# Patient Record
Sex: Male | Born: 1960 | Race: White | Hispanic: No | Marital: Married | Smoking: Never smoker
Health system: Southern US, Community
[De-identification: ages and names within clinical notes are randomized; demographics above are authoritative.]

## PROBLEM LIST (undated history)

## (undated) DIAGNOSIS — J45909 Unspecified asthma, uncomplicated: Secondary | ICD-10-CM

## (undated) DIAGNOSIS — Z91018 Allergy to other foods: Secondary | ICD-10-CM

## (undated) DIAGNOSIS — N200 Calculus of kidney: Secondary | ICD-10-CM

## (undated) HISTORY — PX: LITHOTRIPSY: SUR834

## (undated) HISTORY — PX: UMBILICAL HERNIA REPAIR: SHX196

---

## 1997-06-19 ENCOUNTER — Other Ambulatory Visit: Admission: RE | Admit: 1997-06-19 | Discharge: 1997-06-19 | Payer: Self-pay | Admitting: Specialist

## 1997-10-31 ENCOUNTER — Ambulatory Visit (HOSPITAL_COMMUNITY): Admission: RE | Admit: 1997-10-31 | Discharge: 1997-10-31 | Payer: Self-pay | Admitting: Oral Surgery

## 1997-10-31 ENCOUNTER — Encounter: Payer: Self-pay | Admitting: Oral Surgery

## 1997-12-15 ENCOUNTER — Emergency Department (HOSPITAL_COMMUNITY): Admission: EM | Admit: 1997-12-15 | Discharge: 1997-12-15 | Payer: Self-pay | Admitting: Emergency Medicine

## 1999-05-18 ENCOUNTER — Emergency Department (HOSPITAL_COMMUNITY): Admission: EM | Admit: 1999-05-18 | Discharge: 1999-05-19 | Payer: Self-pay | Admitting: Emergency Medicine

## 2000-06-16 ENCOUNTER — Emergency Department (HOSPITAL_COMMUNITY): Admission: EM | Admit: 2000-06-16 | Discharge: 2000-06-17 | Payer: Self-pay | Admitting: Emergency Medicine

## 2000-06-16 ENCOUNTER — Encounter: Payer: Self-pay | Admitting: Emergency Medicine

## 2002-03-01 ENCOUNTER — Encounter: Payer: Self-pay | Admitting: Emergency Medicine

## 2002-03-01 ENCOUNTER — Emergency Department (HOSPITAL_COMMUNITY): Admission: EM | Admit: 2002-03-01 | Discharge: 2002-03-01 | Payer: Self-pay | Admitting: Emergency Medicine

## 2002-04-27 ENCOUNTER — Encounter: Payer: Self-pay | Admitting: Orthopedic Surgery

## 2002-04-27 ENCOUNTER — Encounter: Admission: RE | Admit: 2002-04-27 | Discharge: 2002-04-27 | Payer: Self-pay | Admitting: Orthopedic Surgery

## 2003-10-02 ENCOUNTER — Emergency Department (HOSPITAL_COMMUNITY): Admission: EM | Admit: 2003-10-02 | Discharge: 2003-10-03 | Payer: Self-pay | Admitting: Emergency Medicine

## 2008-09-16 ENCOUNTER — Emergency Department (HOSPITAL_COMMUNITY): Admission: EM | Admit: 2008-09-16 | Discharge: 2008-09-16 | Payer: Self-pay | Admitting: Emergency Medicine

## 2008-09-23 ENCOUNTER — Encounter: Admission: RE | Admit: 2008-09-23 | Discharge: 2008-09-23 | Payer: Self-pay | Admitting: Chiropractic Medicine

## 2008-10-14 ENCOUNTER — Emergency Department (HOSPITAL_COMMUNITY): Admission: EM | Admit: 2008-10-14 | Discharge: 2008-10-14 | Payer: Self-pay | Admitting: Emergency Medicine
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
## 2010-09-15 IMAGING — CR DG ELBOW COMPLETE 3+V*R*
4 series · 4 of 4 positions shown · non-contrast
Comparison: None available.

CLINICAL DATA: Fall.  Pain and swelling.

RIGHT ELBOW - COMPLETE 3+ VIEW

[x elbow joint ap right]
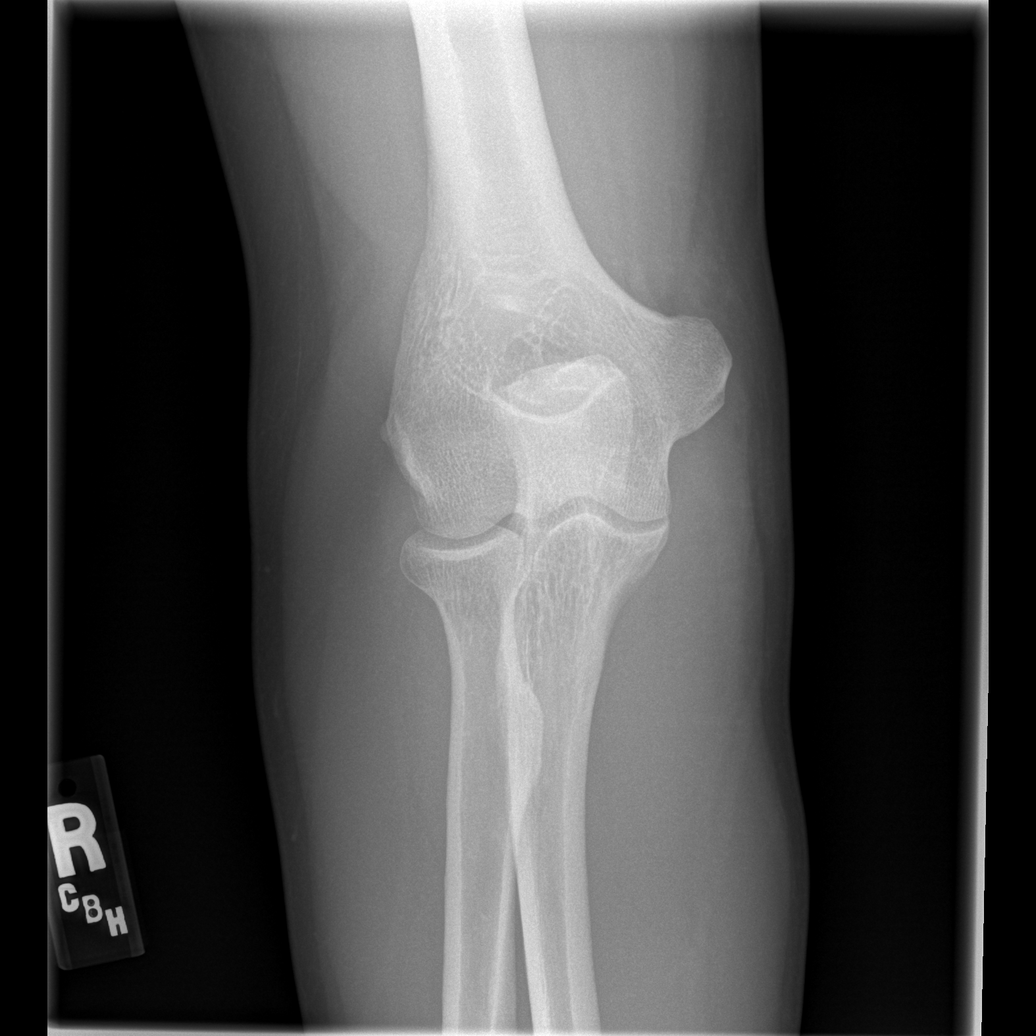

[x elbow joint obl. right (1 of 2)]
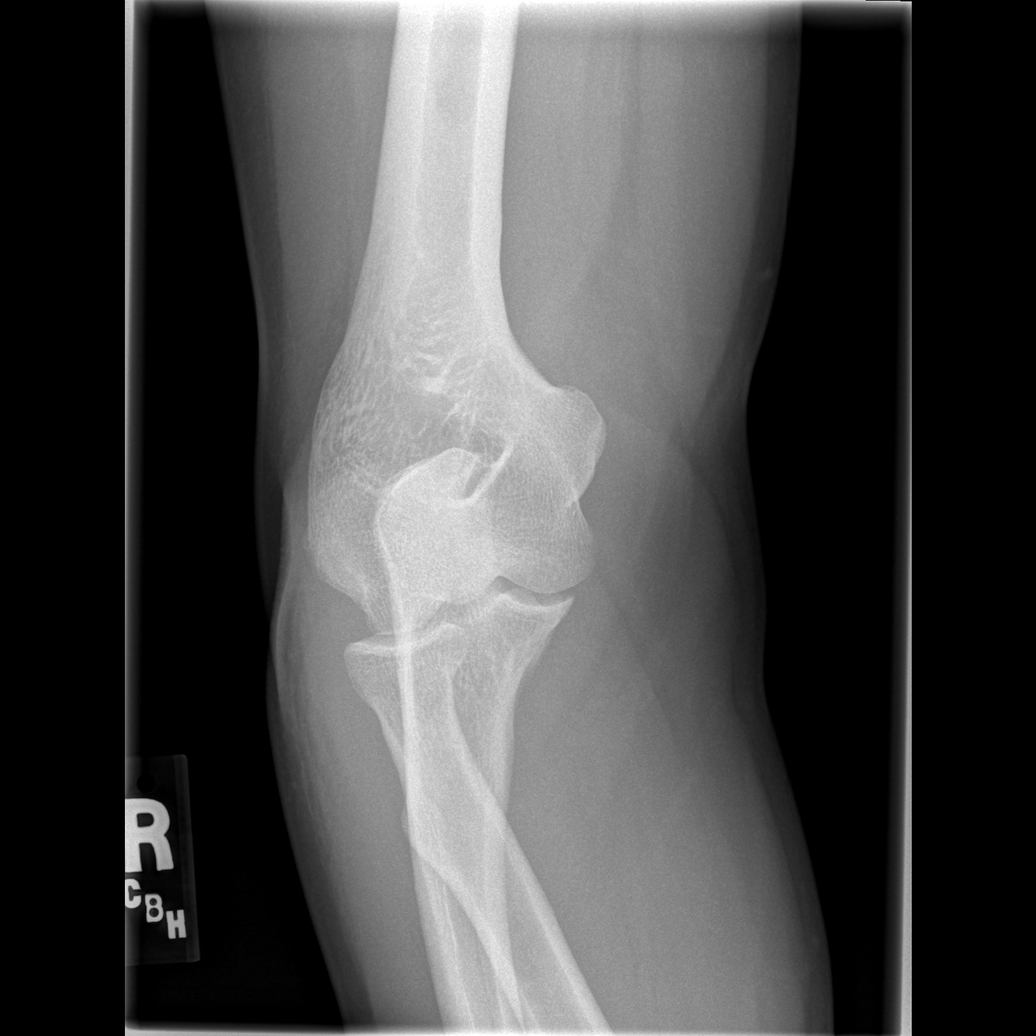

[x elbow joint obl. right (2 of 2)]
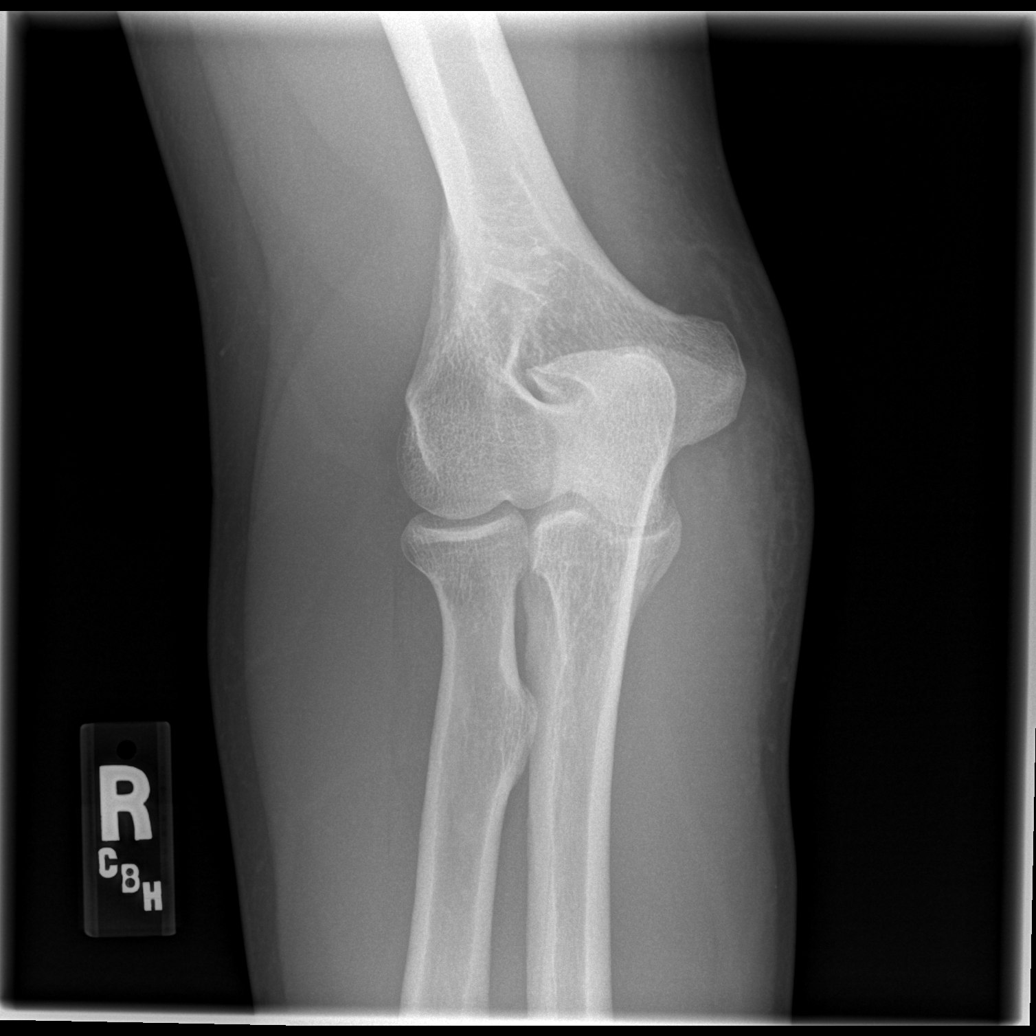

[x elbow joint lat right]
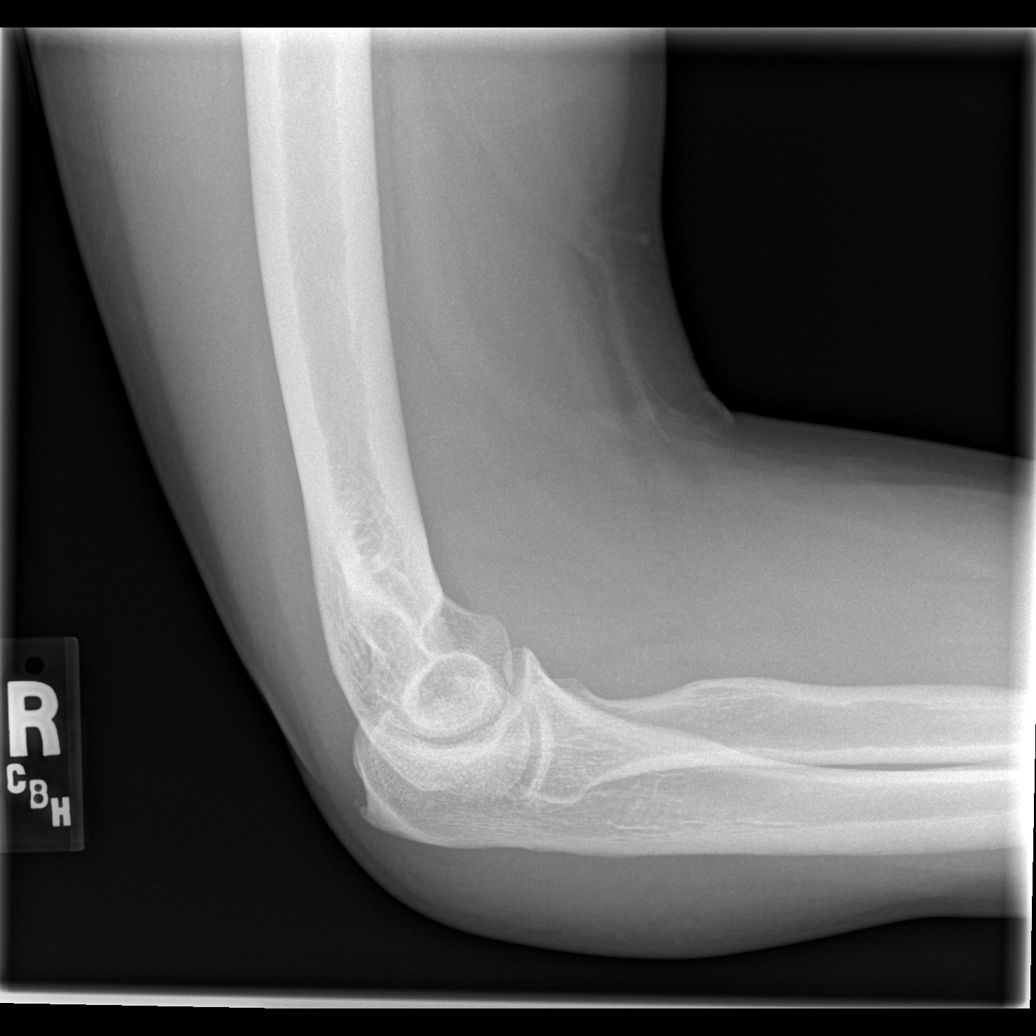

[4 of 4 positions shown; findings below may reference images not displayed]

FINDINGS: No fracture, dislocation or joint effusion is identified.
There appears to be some soft tissue swelling about the medial side
of the elbow.
IMPRESSION: No acute bony or joint abnormality.

## 2010-09-15 IMAGING — CR DG ANKLE COMPLETE 3+V*R*
3 series · 3 of 3 positions shown · non-contrast
Comparison: None available.

CLINICAL DATA: Fall.

RIGHT ANKLE - COMPLETE 3+ VIEW

[t ankle joint ap right]
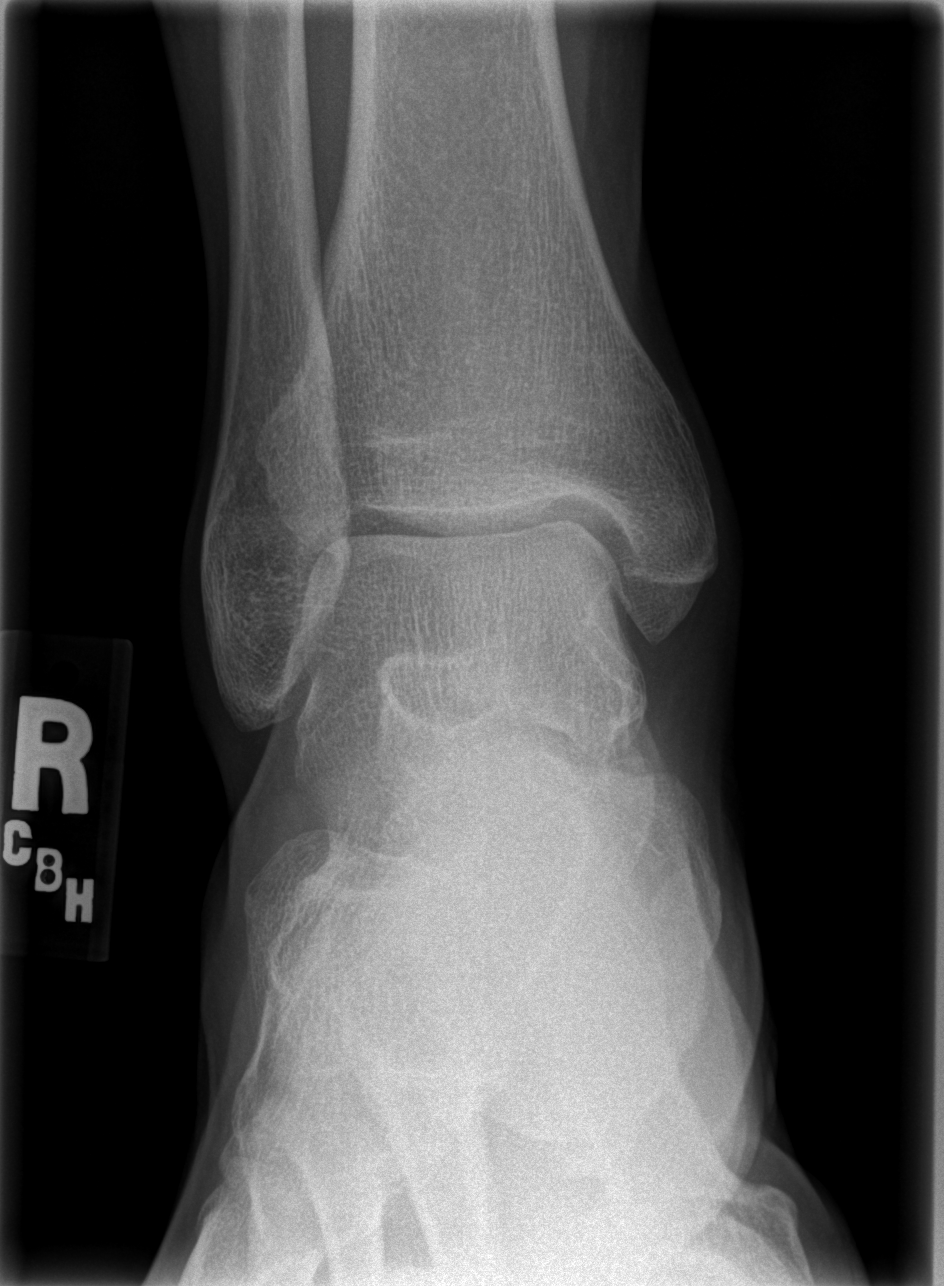

[t ankle joint oblique right]
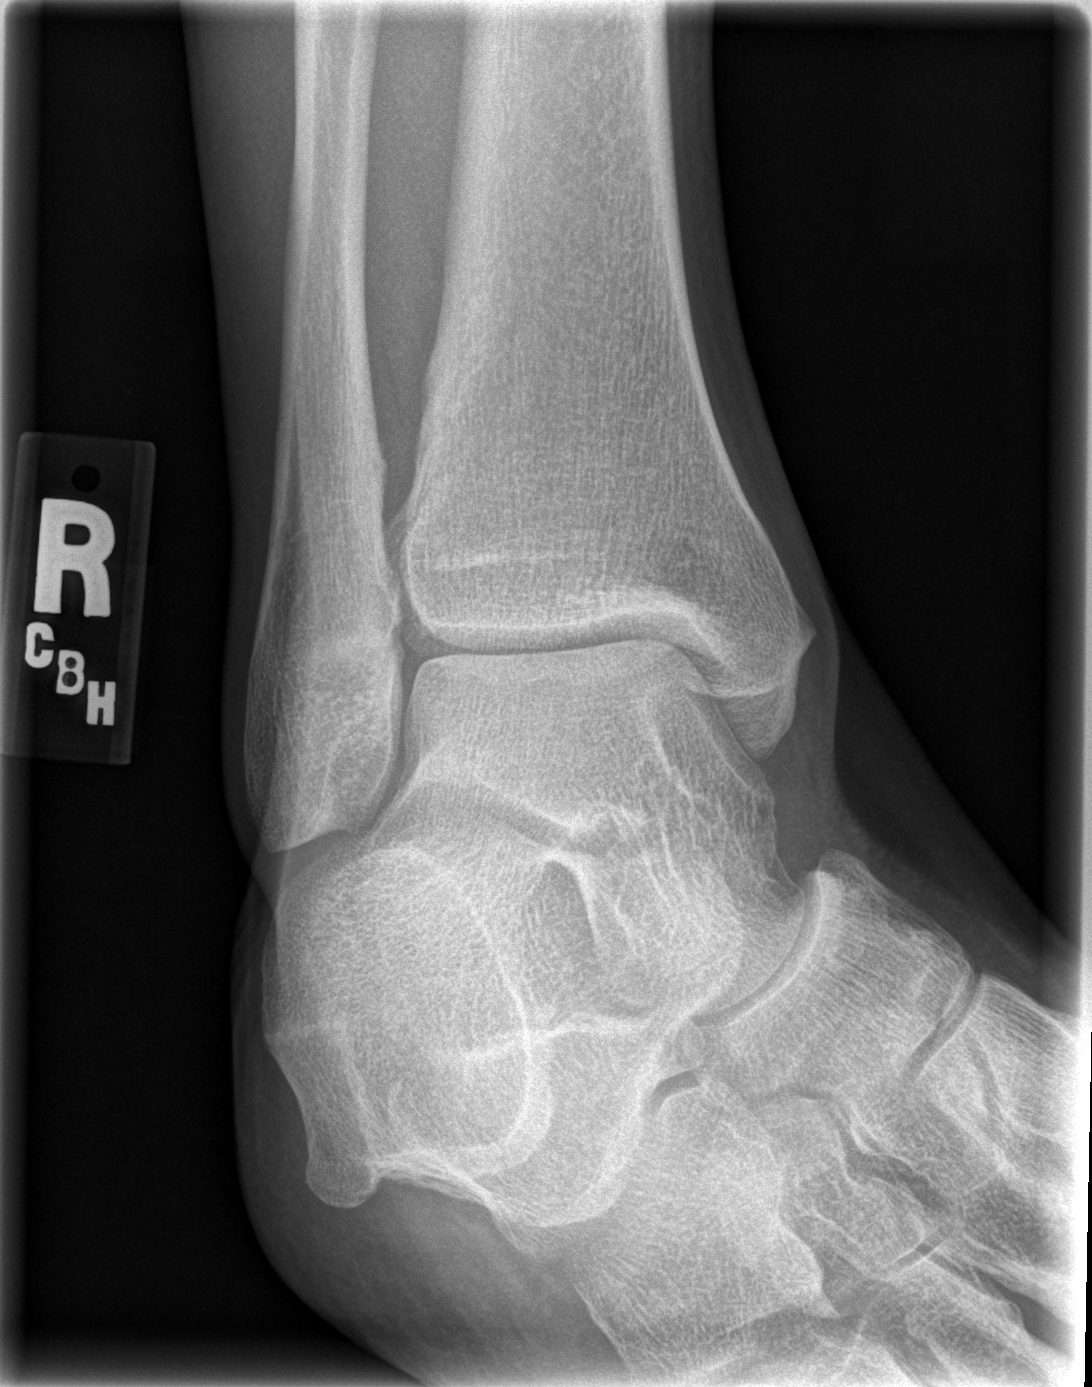

[t ankle joint lat right]
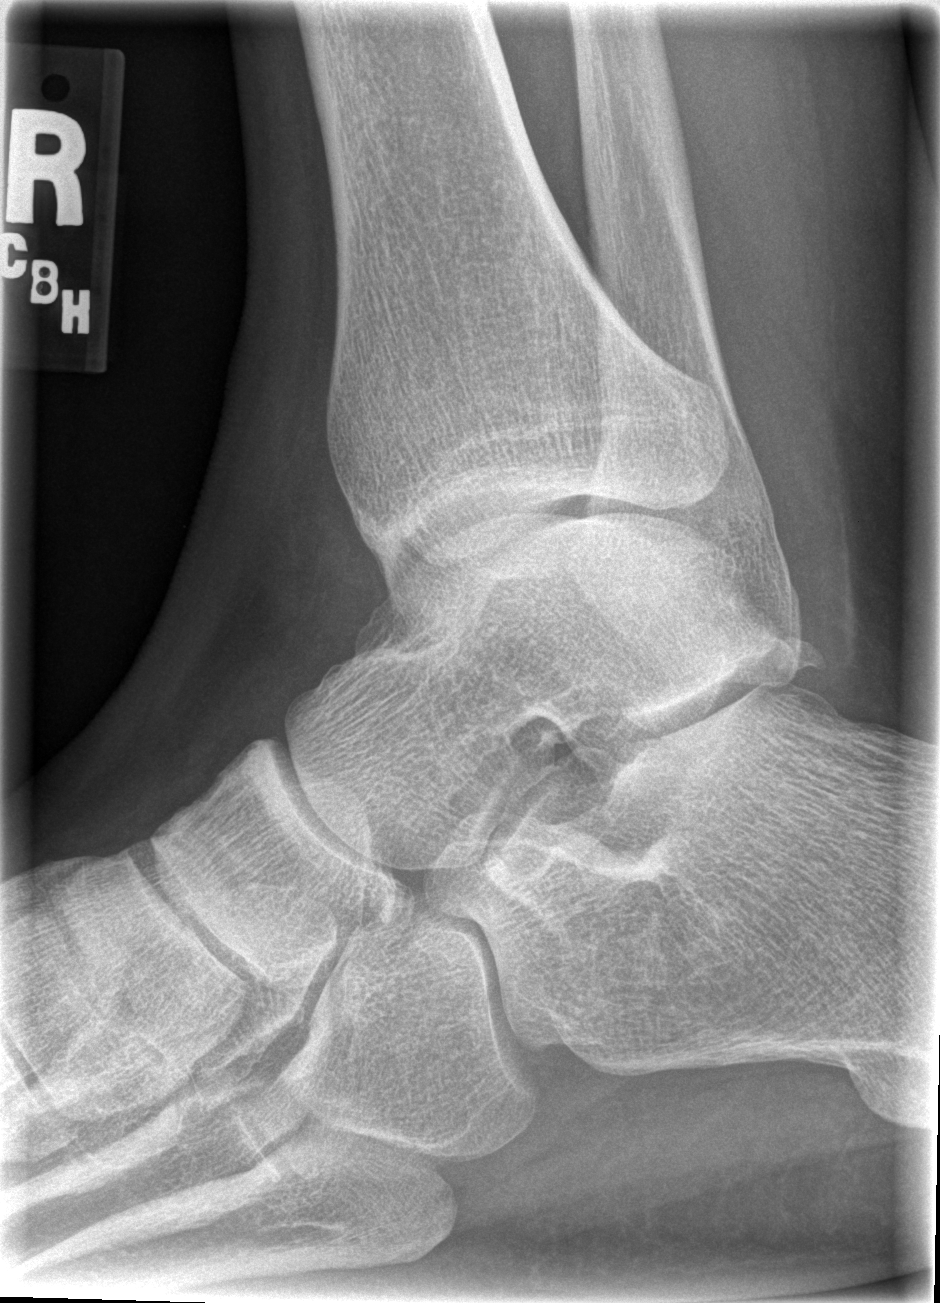

[3 of 3 positions shown; findings below may reference images not displayed]

FINDINGS: Imaged bones, joints and soft tissues appear normal.
IMPRESSION: Negative exam.

## 2014-02-02 ENCOUNTER — Encounter (HOSPITAL_COMMUNITY): Payer: Self-pay | Admitting: *Deleted

## 2014-02-02 ENCOUNTER — Emergency Department (INDEPENDENT_AMBULATORY_CARE_PROVIDER_SITE_OTHER)
Admission: EM | Admit: 2014-02-02 | Discharge: 2014-02-02 | Discharge status: Against Medical Advice | Payer: Self-pay | Source: Home / Self Care

## 2014-02-02 DIAGNOSIS — R221 Localized swelling, mass and lump, neck: Secondary | ICD-10-CM

## 2014-02-02 HISTORY — DX: Calculus of kidney: N20.0

## 2014-02-02 HISTORY — DX: Allergy to other foods: Z91.018

## 2014-02-02 HISTORY — DX: Unspecified asthma, uncomplicated: J45.909

## 2014-02-02 MED ORDER — METHYLPREDNISOLONE SODIUM SUCC 125 MG IJ SOLR
INTRAMUSCULAR | Status: AC
  Filled 2014-02-02: qty 2

## 2014-02-02 MED ORDER — METHYLPREDNISOLONE SODIUM SUCC 125 MG IJ SOLR
125.0000 mg | Freq: Once | INTRAMUSCULAR | Status: AC
  Administered 2014-02-02: 125 mg via INTRAMUSCULAR

## 2014-02-02 NOTE — ED Provider Notes (Addendum)
Nigel SloopMichael W Prisk is a 53 y.o. male who presents to Urgent Care today for throat swelling. Patient is allergic to multiple different bites of food. He ate a little and ring Prilosec and thinks perhaps he is allergic to that. This morning he awoke with throat swelling. 30 minutes ago he took an epinephrine pen and Benadryl which doesn't seem to help much. He is afraid that his throat may swell shut. He denies any trouble breathing or swallowing currently.   Past Medical History  Diagnosis Date  . Asthma   . Multiple food allergies   . Kidney stones    Past Surgical History  Procedure Laterality Date  . Lithotripsy     History  Substance Use Topics  . Smoking status: Never Smoker   . Smokeless tobacco: Not on file  . Alcohol Use: No   ROS as above Medications: No current facility-administered medications for this encounter.   Current Outpatient Prescriptions  Medication Sig Dispense Refill  . Albuterol Sulfate (PROAIR HFA IN) Inhale into the lungs.    Marland Kitchen. EPINEPHrine 0.3 mg/0.3 mL IJ SOAJ injection Inject into the muscle once.     Allergies  Allergen Reactions  . Iodine   . Other     Many food allergies     Exam:  BP 153/97 mmHg  Pulse 96  Temp(Src) 96.7 F (35.9 C) (Oral)  Resp 14  SpO2 95% Gen: Well NAD HEENT: EOMI,  MMM uvula is swollen. Airway is patent. No lip or tongue swelling. Lungs: Normal work of breathing. CTABL Heart: RRR no MRG Abd: NABS, Soft. Nondistended, Nontender Exts: Brisk capillary refill, warm and well perfused.   No results found for this or any previous visit (from the past 24 hour(s)). No results found.  Assessment and Plan: 53 y.o. male with possible food allergy. Patient did not respond well to epinephrine. Plan to transfer to the emergency department. Patient declines EMS. He will go by private vehicle. His wife will drive him. He was given 125 mg of Solu-Medrol IM prior to discharge.  Discussed warning signs or symptoms. Please see  discharge instructions. Patient expresses understanding.     Rodolph BongEvan S Nieshia Larmon, MD 02/02/14 1324   After this note was written but prior to patient being discharged to the emergency room he said he felt better and left AMA.  Rodolph BongEvan S Sherrina Zaugg, MD 02/02/14 [phone]

## 2014-02-02 NOTE — Discharge Instructions (Signed)
Thank you for coming in today. GO Directly to the emergency room

## 2014-02-02 NOTE — ED Notes (Signed)
Pt states he is actually feeling better, and is choosing to sign out AMA.  States he does not want to go to ED because he feels better.  Instructed pt & wife to go their immediately for any worsening, or if they change their minds.  Both verbalized understanding.

## 2014-02-02 NOTE — ED Notes (Signed)
Woke up at 0800 with sensation of swollen throat.  Took Benadryl, also gargled with Benadryl/H2O solution.  Used epi pen @ 1240.  States continues with sensation; reports no real improvement.

## 2014-09-17 ENCOUNTER — Other Ambulatory Visit: Payer: Self-pay | Admitting: Surgery

## 2014-09-17 NOTE — H&P (Signed)
Nigel Sloop 09/17/2014 2:27 PM Location: [facility] Patient #: [id number] DOB: 04-07-1960 Married / Language: [place] / Race: White Male History of Present Illness Ardeth Sportsman MD; 09/17/2014 5:57 PM) Patient words: hernia.  The patient is a 54 year old male who presents with an umbilical hernia. Patient sent for surgical consultation by physician assistant Farris Has. Concern for increasingly symptomatic umbilical hernia. Active overweight male. Had a hernia for at least 13 years. Recently it is become larger in size and become more symptomatic. Getting more sore. Usually has a bowel movement twice a day. No prior belly surgery. He never smokes. No falls or traumas. No fevers chills or sweats. MRSA skin infections. Other Problems Gilmer Mor, CMA; 09/17/2014 2:28 PM) Asthma Back Pain Kidney Stone Umbilical Hernia Repair  Past Surgical History Gilmer Mor, CMA; 09/17/2014 2:28 PM) Oral Surgery  Diagnostic Studies History Gilmer Mor, CMA; 09/17/2014 2:28 PM) Colonoscopy never  Allergies Lamar Laundry Bynum, CMA; 09/17/2014 2:27 PM) Iodine (Antiseptic) *ANTISEPTICS & DISINFECTANTS*  Medication History Lamar Laundry Bynum, CMA; 09/17/2014 2:27 PM) Fluticasone Propionate (50MCG/ACT Suspension, Nasal) Active. Advair HFA (115-21MCG/ACT Aerosol, Inhalation) Active.  Social History Gilmer Mor, CMA; 09/17/2014 2:28 PM) Caffeine use Carbonated beverages. No alcohol use No drug use Tobacco use Never smoker.  Family History Gilmer Mor, CMA; 09/17/2014 2:28 PM) Alcohol Abuse Brother. Arthritis Father, Mother. Heart Disease Father. Melanoma Father.     Review of Systems Lamar Laundry Bynum CMA; 09/17/2014 2:28 PM) General Not Present- Appetite Loss, Chills, Fatigue, Fever, Night Sweats, Weight Gain and Weight Loss. Skin Not Present- Change in Wart/Mole, Dryness, Hives, Jaundice, New Lesions, Non-Healing Wounds, Rash and Ulcer. HEENT Present- Seasonal Allergies and  Wears glasses/contact lenses. Not Present- Earache, Hearing Loss, Hoarseness, Nose Bleed, Oral Ulcers, Ringing in the Ears, Sinus Pain, Sore Throat, Visual Disturbances and Yellow Eyes. Respiratory Present- Snoring. Not Present- Bloody sputum, Chronic Cough, Difficulty Breathing and Wheezing. Breast Not Present- Breast Mass, Breast Pain, Nipple Discharge and Skin Changes. Cardiovascular Present- Shortness of Breath. Not Present- Chest Pain, Difficulty Breathing Lying Down, Leg Cramps, Palpitations, Rapid Heart Rate and Swelling of Extremities. Gastrointestinal Present- Abdominal Pain. Not Present- Bloating, Bloody Stool, Change in Bowel Habits, Chronic diarrhea, Constipation, Difficulty Swallowing, Excessive gas, Gets full quickly at meals, Hemorrhoids, Indigestion, Nausea, Rectal Pain and Vomiting. Male Genitourinary Present- Blood in Urine. Not Present- Change in Urinary Stream, Frequency, Impotence, Nocturia, Painful Urination, Urgency and Urine Leakage. Musculoskeletal Not Present- Back Pain, Joint Pain, Joint Stiffness, Muscle Pain, Muscle Weakness and Swelling of Extremities. Neurological Not Present- Decreased Memory, Fainting, Headaches, Numbness, Seizures, Tingling, Tremor, Trouble walking and Weakness. Psychiatric Not Present- Anxiety, Bipolar, Change in Sleep Pattern, Depression, Fearful and Frequent crying. Endocrine Not Present- Cold Intolerance, Excessive Hunger, Hair Changes, Heat Intolerance, Hot flashes and New Diabetes. Hematology Not Present- Easy Bruising, Excessive bleeding, Gland problems, HIV and Persistent Infections.  Vitals (Sonya Bynum CMA; 09/17/2014 2:28 PM) 09/17/2014 2:28 PM Weight: 231 lb Height: 72in Body Surface Area: 2.31 m Body Mass Index: 31.33 kg/m Temp.: 98.26F(Temporal)  Pulse: 94 (Regular)  BP: 130/82 (Sitting, Left Arm, Standard)     Physical Exam Ardeth Sportsman MD; 09/17/2014 3:22 PM)  General Mental Status-Alert. General  Appearance-Not in acute distress, Not Sickly. Orientation-Oriented X3. Hydration-Well hydrated. Voice-Normal.  Integumentary Global Assessment Upon inspection and palpation of skin surfaces of the - Axillae: non-tender, no inflammation or ulceration, no drainage. and Distribution of scalp and body hair is normal. General Characteristics Temperature - normal warmth is noted.  Head and  Neck Head-normocephalic, atraumatic with no lesions or palpable masses. Face Global Assessment - atraumatic, no absence of expression. Neck Global Assessment - no abnormal movements, no bruit auscultated on the right, no bruit auscultated on the left, no decreased range of motion, non-tender. Trachea-midline. Thyroid Gland Characteristics - non-tender.  Eye Eyeball - Left-Extraocular movements intact, No Nystagmus. Eyeball - Right-Extraocular movements intact, No Nystagmus. Cornea - Left-No Hazy. Cornea - Right-No Hazy. Sclera/Conjunctiva - Left-No scleral icterus, No Discharge. Sclera/Conjunctiva - Right-No scleral icterus, No Discharge. Pupil - Left-Direct reaction to light normal. Pupil - Right-Direct reaction to light normal.  ENMT Ears Pinna - Left - no drainage observed, no generalized tenderness observed. Right - no drainage observed, no generalized tenderness observed. Nose and Sinuses External Inspection of the Nose - no destructive lesion observed. Inspection of the nares - Left - quiet respiration. Right - quiet respiration. Mouth and Throat Lips - Upper Lip - no fissures observed, no pallor noted. Lower Lip - no fissures observed, no pallor noted. Nasopharynx - no discharge present. Oral Cavity/Oropharynx - Tongue - no dryness observed. Oral Mucosa - no cyanosis observed. Hypopharynx - no evidence of airway distress observed.  Chest and Lung Exam Inspection Movements - Normal and Symmetrical. Accessory muscles - No use of accessory muscles in  breathing. Palpation Palpation of the chest reveals - Non-tender. Auscultation Breath sounds - Normal and Clear.  Cardiovascular Auscultation Rhythm - Regular. Murmurs & Other Heart Sounds - Auscultation of the heart reveals - No Murmurs and No Systolic Clicks.  Abdomen Inspection Inspection of the abdomen reveals - No Visible peristalsis and No Abnormal pulsations. Umbilicus - No Bleeding, No Urine drainage. Palpation/Percussion Palpation and Percussion of the abdomen reveal - Soft, Non Tender, No Rebound tenderness, No Rigidity (guarding) and No Cutaneous hyperesthesia. Note: Obese but soft. 3 x 3 cm incarcerated umbilical mass that is eventually reducible. To have centimeter umbilical defect. Smaller supraumbilical defect as well.   Male Genitourinary Sexual Maturity Tanner 5 - Adult hair pattern and Adult penile size and shape.  Peripheral Vascular Upper Extremity Inspection - Left - No Cyanotic nailbeds, Not Ischemic. Right - No Cyanotic nailbeds, Not Ischemic.  Neurologic Neurologic evaluation reveals -normal attention span and ability to concentrate, able to name objects and repeat phrases. Appropriate fund of knowledge , normal sensation and normal coordination. Mental Status Affect - not angry, not paranoid. Cranial Nerves-Normal Bilaterally. Gait-Normal.  Neuropsychiatric Mental status exam performed with findings of-able to articulate well with normal speech/language, rate, volume and coherence, thought content normal with ability to perform basic computations and apply abstract reasoning and no evidence of hallucinations, delusions, obsessions or homicidal/suicidal ideation.  Musculoskeletal Global Assessment Spine, Ribs and Pelvis - no instability, subluxation or laxity. Right Upper Extremity - no instability, subluxation or laxity.  Lymphatic Head & Neck  General Head & Neck Lymphatics: Bilateral - Description - No Localized  lymphadenopathy. Axillary  General Axillary Region: Bilateral - Description - No Localized lymphadenopathy. Femoral & Inguinal  Generalized Femoral & Inguinal Lymphatics: Left - Description - No Localized lymphadenopathy. Right - Description - No Localized lymphadenopathy.    Assessment & Plan Ardeth Sportsman MD; 09/17/2014 3:21 PM)  UMBILICAL HERNIA WITHOUT OBSTRUCTION AND WITHOUT GANGRENE (553.1  K42.9) Impression: Increasingly symptomatic hernia that is reducible with effort. He may have a supraumbilical hernia as well.  I think he would benefit from surgical repair since it has increased in size and makes become symptomatic. He wishes to be aggressive and have it repaired as  well. I would do a laparoscopic exploration and underlay repair with mesh. He is a big active Michelle Piper & has a lot more decades of life and him for this to fall apart, so I would be use mesh repair. He agrees. He wishes to try and coordinate time that is convenient. He seemed disappointed when I told him he would not be able to immediately go back to work. 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  Current Plans Schedule for Surgery Written instructions provided Pt Education - Pamphlet Given - Laparoscopic Hernia Repair: discussed with patient and provided information. The anatomy & physiology of the abdominal wall was discussed. The pathophysiology of hernias was discussed. Natural history risks without surgery including progeressive enlargement, pain, incarceration, & strangulation was discussed. Contributors to complications such as smoking, obesity, diabetes, prior surgery, etc were discussed.  I feel the risks of no intervention will lead to serious problems that outweigh the operative risks; therefore, I recommended surgery to reduce and repair the hernia. I explained laparoscopic techniques with possible need for an open approach. I noted the probable use of mesh to patch  and/or buttress the hernia repair  Risks such as bleeding, infection, abscess, need for further treatment, heart attack, death, and other risks were discussed. I noted a good likelihood this will help address the problem. Goals of post-operative recovery were discussed as well. Possibility that this will not correct all symptoms was explained. I stressed the importance of low-impact activity, aggressive pain control, avoiding constipation, & not pushing through pain to minimize risk of post-operative chronic pain or injury. Possibility of reherniation especially with smoking, obesity, diabetes, immunosuppression, and other health conditions was discussed. We will work to minimize complications.  An educational handout further explaining the pathology & treatment options was given as well. Questions were answered. The patient expresses understanding & wishes to proceed with surgery. Pt Education - [facility] Hernia Post-Op HCI (Marshon Bangs): discussed with patient and provided information. Pt Education - [facility] Pain Control (Laryn Venning)  Ardeth Sportsman, M.D., F.A.C.S. Gastrointestinal and Minimally Invasive Surgery [facility] [address], [address] [place], [place] [address] [phone] Main / Paging

## 2015-08-22 DIAGNOSIS — J45909 Unspecified asthma, uncomplicated: Secondary | ICD-10-CM | POA: Insufficient documentation

## 2015-08-22 DIAGNOSIS — N200 Calculus of kidney: Secondary | ICD-10-CM

## 2015-08-22 HISTORY — DX: Calculus of kidney: N20.0

## 2016-08-18 ENCOUNTER — Encounter: Payer: Self-pay | Admitting: Allergy and Immunology

## 2016-08-18 ENCOUNTER — Ambulatory Visit (INDEPENDENT_AMBULATORY_CARE_PROVIDER_SITE_OTHER): Payer: BLUE CROSS/BLUE SHIELD | Admitting: Allergy and Immunology

## 2016-08-18 VITALS — BP 122/78 | HR 90 | Temp 97.9°F | Resp 19 | Ht 70.5 in | Wt 233.6 lb

## 2016-08-18 DIAGNOSIS — J3089 Other allergic rhinitis: Secondary | ICD-10-CM

## 2016-08-18 DIAGNOSIS — T781XXD Other adverse food reactions, not elsewhere classified, subsequent encounter: Secondary | ICD-10-CM | POA: Diagnosis not present

## 2016-08-18 DIAGNOSIS — J4540 Moderate persistent asthma, uncomplicated: Secondary | ICD-10-CM

## 2016-08-18 DIAGNOSIS — Z91018 Allergy to other foods: Secondary | ICD-10-CM | POA: Diagnosis not present

## 2016-08-18 MED ORDER — FLUTICASONE PROPIONATE HFA 220 MCG/ACT IN AERO: INHALATION_SPRAY | RESPIRATORY_TRACT | 5 refills | Status: DC

## 2016-08-18 MED ORDER — MONTELUKAST SODIUM 10 MG PO TABS: 10.0000 mg | ORAL_TABLET | Freq: Every day | ORAL | 5 refills | Status: DC

## 2016-08-18 NOTE — Progress Notes (Signed)
NEW PATIENT NOTE  Referring Provider: No ref. provider found Primary Provider: No primary care provider on file. Date of office visit: 08/18/2016    Subjective:   Chief Complaint:  Raymond Foster (DOB: 03/17/1960) is a 56 y.o. male who Foster to the clinic on 08/18/2016 with a chief complaint of Nasal Congestion and Allergic Rhinitis  .  HPI: Raymond Foster to this clinic in evaluation of allergic disease.  Raymond Foster has a long history of asthma and allergic rhinoconjunctivitis that Raymond Foster thinks is under pretty good control until Raymond Foster develops a infection. Whenever Raymond Foster develops an infection, which appears to be a frequency of 1 or 2 times per year, Raymond Foster loses control of his asthma. Most recently Raymond Foster ended up in the urgent care center 2 weeks ago with a head cold and required prednisone and Augmentin as Raymond Foster had involvement of his lungs with that episode. Raymond Foster is slowly improving from that episode. Raymond Foster still uses a DuoNeb about once or twice a day at this point.  His asthma is controlled with Advair. It appears to be a perennial problem but may be a little bit more activity during the fall. His triggers include cold air exposure and the development of a head cold. Raymond Foster usually does not use a short acting bronchodilator unless Raymond Foster is sick and then Raymond Foster can use that medication up to 2-3 times per day. His asthma is manifested for the most part as wheezing and shortness of breath. Raymond Foster does not really exercise to any degree.  Raymond Foster does have a history of sneezing and some congestion that usually Foster itself during the fall. This is not a particularly big issue for him and Raymond Foster will take some over-the-counter antihistamine. Raymond Foster does not use a nasal steroid except when Raymond Foster is very sick with an infection. Provoking factors for her symptoms include exposure to pollen.  Raymond Foster also appears to have an issue with food allergies. Raymond Foster has developed very significant reactions with mouth and throat swelling when eating peanuts and  walnuts although Raymond Foster can eat almonds and pistachios with no problem. When Raymond Foster eats apples and various other fruits such as melons and cantaloupe Raymond Foster gets intense mouth itching and throat itching and swelling.  Apparently Raymond Foster was treated with immunotherapy for 4 years which she discontinued approximately 10 years ago.  Past Medical History:  Diagnosis Date  . Asthma   . Kidney stones   . Multiple food allergies     Past Surgical History:  Procedure Laterality Date  . LITHOTRIPSY      Allergies as of 08/18/2016      Reactions   Iodine Other (See Comments)   Can't remember reaction   Other    Other reaction(s): Unknown Many food allergies Many food allergies      Medication List      EPINEPHrine 0.3 mg/0.3 mL Soaj injection Commonly known as:  EPI-PEN Inject into the muscle once.   fluticasone-salmeterol 115-21 MCG/ACT inhaler Commonly known as:  ADVAIR HFA Inhale into the lungs.   PROAIR HFA IN Inhale into the lungs.       Review of systems negative except as noted in HPI / PMHx or noted below:  Review of Systems  Constitutional: Negative.   HENT: Negative.   Eyes: Negative.   Respiratory: Negative.   Cardiovascular: Negative.   Gastrointestinal: Negative.   Genitourinary: Negative.   Musculoskeletal: Negative.   Skin: Negative.   Neurological: Negative.   Endo/Heme/Allergies: Negative.   Psychiatric/Behavioral:  Negative.     History reviewed. No pertinent family history.  Social History   Social History  . Marital status: Single    Spouse name: N/A  . Number of children: N/A  . Years of education: N/A   Occupational History  . Not on file.   Social History Main Topics  . Smoking status: Never Smoker  . Smokeless tobacco: Never Used  . Alcohol use No  . Drug use: No  . Sexual activity: Not on file   Other Topics Concern  . Not on file   Social History Narrative  . No narrative on file    Environmental and Social history  Lives in a  house with a dry environment, a dog located inside the household, carpeting in the bedroom, no plastic on the bed, no plastic on the pillow, and no smokers located inside the household. Raymond Foster works in an [employer].  Objective:   Vitals:   08/18/16 0943  BP: 122/78  Pulse: 90  Resp: 19  Temp: 97.9 F (36.6 C)   Height: 5' 10.5" (179.1 cm) Weight: 233 lb 9.6 oz (106 kg)  Physical Exam  Constitutional: Raymond Foster is well-developed, well-nourished, and in no distress.  Slightly nasal voice  HENT:  Head: Normocephalic. Head is without right periorbital erythema and without left periorbital erythema.  Right Ear: Tympanic membrane, external ear and ear canal normal.  Left Ear: Tympanic membrane, external ear and ear canal normal.  Nose: Nose normal. No mucosal edema or rhinorrhea.  Mouth/Throat: Uvula is midline, oropharynx is clear and moist and mucous membranes are normal. No oropharyngeal exudate.  Eyes: Conjunctivae and lids are normal. Pupils are equal, round, and reactive to light.  Neck: Trachea normal. No tracheal tenderness present. No tracheal deviation present. No thyromegaly present.  Cardiovascular: Normal rate, regular rhythm, S1 normal, S2 normal and normal heart sounds.   No murmur heard. Pulmonary/Chest: Effort normal and breath sounds normal. No stridor. No tachypnea. No respiratory distress. Raymond Foster has no wheezes. Raymond Foster has no rales. Raymond Foster exhibits no tenderness.  Abdominal: Soft. Raymond Foster exhibits no distension and no mass. There is no hepatosplenomegaly. There is no tenderness. There is no rebound and no guarding.  Musculoskeletal: Raymond Foster exhibits no edema or tenderness.  Lymphadenopathy:       Head (right side): No tonsillar adenopathy present.       Head (left side): No tonsillar adenopathy present.    Raymond Foster has no cervical adenopathy.    Raymond Foster has no axillary adenopathy.  Neurological: Raymond Foster is alert. Gait normal.  Skin: No rash noted. Raymond Foster is not diaphoretic. No erythema. No pallor. Nails show  no clubbing.  Psychiatric: Mood and affect normal.    Diagnostics: Allergy skin tests were not performed.   Spirometry was performed and demonstrated an FEV1 of 3.58 @ 93 % of predicted.  Assessment and Plan:    1. Asthma, moderate persistent, well-controlled   2. Other allergic rhinitis   3. Pollen-food allergy, subsequent encounter   4. Food allergy     1. Allergen avoidance measures - dust mite  2. Treat and prevent inflammation:   A. Advair 115 - one inhalation twice a day  3. If needed:   A. ProAir HFA or DuoNeb every 4-6 hours  B. EpiPen, Benadryl, M.D./ER evaluation for allergic reaction  C. OTC antihistamine  D. nasal saline  4. "Action plan" for asthma flare up:   A. continue Advair twice a day  B. start Flovent 220 - 3 inhalations 3 times  a day with spacer  C. Start montelukast 10 mg tablet 1 time per day  D. use ProAir HFA or DuoNeb if needed  5.  Can add OTC Nasacort/Rhinocort/Flonase one-2 sprays each nostril one time per day during periods of upper airway symptoms.  6.  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  Obtain fall flu vaccine  8.  Return to clinic in 6 months or earlier if problem  Muzammil does appear to have a component of atopic disease affecting his airway and hopefully with the plan mentioned above Raymond Foster will do well in a preventative manner and also have a good response to an action plan whenever Raymond Foster does develop a flare of his asthma. 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 Although not indicated for the treatment of oral pollinosis we usually see a very good response regarding this condition when undergoing aero allergen immunotherapy. Raymond Foster is presently considering this option. I will see him back in this clinic in 6 months or earlier if there is a problem.   Laurette Schimke, MD Allergy / Immunology [facility]

## 2016-08-18 NOTE — Patient Instructions (Addendum)
  1. Allergen avoidance measures - dust mite  2. Treat and prevent inflammation:   A. Advair 115 - one inhalation twice a day  3. If needed:   A. ProAir HFA or DuoNeb every 4-6 hours  B. EpiPen, Benadryl, M.D./ER evaluation for allergic reaction  C. OTC antihistamine  D. nasal saline  4. "Action plan" for asthma flare up:   A. continue Advair twice a day  B. start Flovent 220 - 3 inhalations 3 times a day with spacer  C. Start montelukast 10 mg tablet 1 time per day  D. use ProAir HFA or DuoNeb if needed  5.  Can add OTC Nasacort/Rhinocort/Flonase one-2 sprays each nostril one time per day during periods of upper airway symptoms.  6.  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  Obtain fall flu vaccine  8.  Return to clinic in 6 months or earlier if problem

## 2018-10-12 DIAGNOSIS — M6208 Separation of muscle (nontraumatic), other site: Secondary | ICD-10-CM | POA: Insufficient documentation

## 2022-01-06 DIAGNOSIS — M545 Low back pain, unspecified: Secondary | ICD-10-CM | POA: Insufficient documentation

## 2022-06-22 DIAGNOSIS — Z6830 Body mass index (BMI) 30.0-30.9, adult: Secondary | ICD-10-CM | POA: Diagnosis not present

## 2022-06-22 DIAGNOSIS — J0140 Acute pansinusitis, unspecified: Secondary | ICD-10-CM | POA: Diagnosis not present

## 2023-03-14 DIAGNOSIS — M25511 Pain in right shoulder: Secondary | ICD-10-CM | POA: Insufficient documentation

## 2023-03-15 DIAGNOSIS — M79672 Pain in left foot: Secondary | ICD-10-CM | POA: Diagnosis not present

## 2023-03-15 DIAGNOSIS — M25511 Pain in right shoulder: Secondary | ICD-10-CM | POA: Diagnosis not present

## 2023-03-28 ENCOUNTER — Emergency Department (HOSPITAL_BASED_OUTPATIENT_CLINIC_OR_DEPARTMENT_OTHER)
Admission: EM | Admit: 2023-03-28 | Discharge: 2023-03-28 | Disposition: A | Discharge status: Home / Self Care | Payer: 59 | Attending: Emergency Medicine | Admitting: Emergency Medicine

## 2023-03-28 ENCOUNTER — Emergency Department (HOSPITAL_BASED_OUTPATIENT_CLINIC_OR_DEPARTMENT_OTHER): Payer: 59

## 2023-03-28 ENCOUNTER — Other Ambulatory Visit: Payer: Self-pay

## 2023-03-28 ENCOUNTER — Encounter (HOSPITAL_BASED_OUTPATIENT_CLINIC_OR_DEPARTMENT_OTHER): Payer: Self-pay | Admitting: *Deleted

## 2023-03-28 ENCOUNTER — Emergency Department (HOSPITAL_BASED_OUTPATIENT_CLINIC_OR_DEPARTMENT_OTHER): Payer: 59 | Admitting: Radiology

## 2023-03-28 DIAGNOSIS — J45909 Unspecified asthma, uncomplicated: Secondary | ICD-10-CM | POA: Diagnosis not present

## 2023-03-28 DIAGNOSIS — R0602 Shortness of breath: Secondary | ICD-10-CM | POA: Diagnosis not present

## 2023-03-28 DIAGNOSIS — R0789 Other chest pain: Secondary | ICD-10-CM | POA: Diagnosis not present

## 2023-03-28 DIAGNOSIS — R079 Chest pain, unspecified: Secondary | ICD-10-CM | POA: Diagnosis not present

## 2023-03-28 DIAGNOSIS — Z87442 Personal history of urinary calculi: Secondary | ICD-10-CM | POA: Diagnosis not present

## 2023-03-28 DIAGNOSIS — K859 Acute pancreatitis without necrosis or infection, unspecified: Secondary | ICD-10-CM | POA: Diagnosis not present

## 2023-03-28 DIAGNOSIS — R1013 Epigastric pain: Secondary | ICD-10-CM | POA: Diagnosis not present

## 2023-03-28 DIAGNOSIS — Z7951 Long term (current) use of inhaled steroids: Secondary | ICD-10-CM | POA: Diagnosis not present

## 2023-03-28 DIAGNOSIS — N200 Calculus of kidney: Secondary | ICD-10-CM | POA: Diagnosis not present

## 2023-03-28 DIAGNOSIS — K760 Fatty (change of) liver, not elsewhere classified: Secondary | ICD-10-CM | POA: Diagnosis not present

## 2023-03-28 LAB — HEPATIC FUNCTION PANEL
ALT: 32 U/L (ref 0–44)
AST: 18 U/L (ref 15–41)
Albumin: 4.4 g/dL (ref 3.5–5.0)
Alkaline Phosphatase: 67 U/L (ref 38–126)
Bilirubin, Direct: 0.2 mg/dL (ref 0.0–0.2)
Indirect Bilirubin: 0.6 mg/dL (ref 0.3–0.9)
Total Bilirubin: 0.8 mg/dL (ref 0.0–1.2)
Total Protein: 7.7 g/dL (ref 6.5–8.1)

## 2023-03-28 LAB — URINALYSIS, W/ REFLEX TO CULTURE (INFECTION SUSPECTED)
Bacteria, UA: NONE SEEN
Bilirubin Urine: NEGATIVE
Glucose, UA: NEGATIVE mg/dL
Ketones, ur: NEGATIVE mg/dL
Leukocytes,Ua: NEGATIVE
Nitrite: NEGATIVE
Specific Gravity, Urine: 1.021 (ref 1.005–1.030)
pH: 7 (ref 5.0–8.0)

## 2023-03-28 LAB — CBC
HCT: 49.5 % (ref 39.0–52.0)
Hemoglobin: 16.9 g/dL (ref 13.0–17.0)
MCH: 31.4 pg (ref 26.0–34.0)
MCHC: 34.1 g/dL (ref 30.0–36.0)
MCV: 91.8 fL (ref 80.0–100.0)
Platelets: 223 10*3/uL (ref 150–400)
RBC: 5.39 MIL/uL (ref 4.22–5.81)
RDW: 13.4 % (ref 11.5–15.5)
WBC: 10.7 10*3/uL — ABNORMAL HIGH (ref 4.0–10.5)
nRBC: 0 % (ref 0.0–0.2)

## 2023-03-28 LAB — BASIC METABOLIC PANEL
Anion gap: 9 (ref 5–15)
BUN: 24 mg/dL — ABNORMAL HIGH (ref 8–23)
CO2: 28 mmol/L (ref 22–32)
Calcium: 9.3 mg/dL (ref 8.9–10.3)
Chloride: 101 mmol/L (ref 98–111)
Creatinine, Ser: 1.36 mg/dL — ABNORMAL HIGH (ref 0.61–1.24)
GFR, Estimated: 59 mL/min — ABNORMAL LOW (ref >60)
Glucose, Bld: 108 mg/dL — ABNORMAL HIGH (ref 70–99)
Potassium: 4.2 mmol/L (ref 3.5–5.1)
Sodium: 138 mmol/L (ref 135–145)

## 2023-03-28 LAB — LIPASE, BLOOD: Lipase: 228 U/L — ABNORMAL HIGH (ref 11–51)

## 2023-03-28 LAB — TRIGLYCERIDES: Triglycerides: 136 mg/dL (ref <150)

## 2023-03-28 LAB — TROPONIN I (HIGH SENSITIVITY)
Troponin I (High Sensitivity): 4 ng/L (ref <18)
Troponin I (High Sensitivity): 3 ng/L (ref <18)

## 2023-03-28 MED ORDER — FENTANYL CITRATE PF 50 MCG/ML IJ SOSY: 50.0000 ug | PREFILLED_SYRINGE | INTRAMUSCULAR | Status: DC | PRN

## 2023-03-28 MED ORDER — ALUM & MAG HYDROXIDE-SIMETH 200-200-20 MG/5ML PO SUSP
30.0000 mL | Freq: Once | ORAL | Status: AC
  Administered 2023-03-28: 30 mL via ORAL
  Filled 2023-03-28: qty 30

## 2023-03-28 MED ORDER — LACTATED RINGERS IV BOLUS
1000.0000 mL | Freq: Once | INTRAVENOUS | Status: AC
  Administered 2023-03-28: 1000 mL via INTRAVENOUS

## 2023-03-28 MED ORDER — HYDROCODONE-ACETAMINOPHEN 5-325 MG PO TABS: 1.0000 | ORAL_TABLET | Freq: Four times a day (QID) | ORAL | 0 refills | Status: DC | PRN

## 2023-03-28 MED ORDER — FENTANYL CITRATE PF 50 MCG/ML IJ SOSY: 50.0000 ug | PREFILLED_SYRINGE | Freq: Once | INTRAMUSCULAR | Status: DC

## 2023-03-28 MED ORDER — LIDOCAINE VISCOUS HCL 2 % MT SOLN
15.0000 mL | Freq: Once | OROMUCOSAL | Status: AC
  Administered 2023-03-28: 15 mL via ORAL
  Filled 2023-03-28: qty 15

## 2023-03-28 MED ORDER — ONDANSETRON 4 MG PO TBDP: 4.0000 mg | ORAL_TABLET | Freq: Three times a day (TID) | ORAL | 0 refills | Status: DC | PRN

## 2023-03-28 NOTE — ED Notes (Signed)
 Pt refused to be swabbed for COVID/flu/RSV.

## 2023-03-28 NOTE — ED Notes (Signed)
 Patient transported to CT

## 2023-03-28 NOTE — ED Provider Notes (Signed)
 I assumed care of the patient at 330 from Dr. Karene Fry.  I have independently visualized and interpreted pt's images today. CT without evidence of significant pancreas abnormality.  Radiology reports bilateral renal stones but no other acute abnormalities.   Gwyneth Sprout, MD 03/28/23 [phone]

## 2023-03-28 NOTE — ED Provider Notes (Signed)
 [facility] Provider Note   CSN: [id number] Arrival date & time: 03/28/23  1127     History  Chief Complaint  Patient presents with   Chest Pain    Raymond Foster is a 63 y.o. male.   Chest Pain Associated symptoms: abdominal pain and nausea      63 year old male with medical history significant for asthma, kidney stones, multiple food allergies who presents to the emergency department with epigastric abdominal pain radiating to his chest.  The patient has had symptoms for the past day or so.  He endorses mild nausea, no vomiting.  He is passing gas and moving his bowels normally.  No fevers, chills or cough.  He felt some generalized abdominal bloating and discomfort as well which has since resolved.  He is tolerating oral intake.  Some burning sensation as well in his epigastrium.  Some radiation to his back.  Home Medications Prior to Admission medications   Medication Sig Start Date End Date Taking? Authorizing Provider  Albuterol Sulfate (PROAIR HFA IN) Inhale into the lungs.    [provider]  EPINEPHrine 0.3 mg/0.3 mL IJ SOAJ injection Inject into the muscle once.    [provider]  fluticasone (FLOVENT HFA) 220 MCG/ACT inhaler 3 inhalations three times a day with spacer 08/18/16   Kozlow, Alvira Philips, MD  fluticasone-salmeterol (ADVAIR HFA) 115-21 MCG/ACT inhaler Inhale into the lungs.    [provider]  montelukast (SINGULAIR) 10 MG tablet Take 1 tablet (10 mg total) by mouth at bedtime. 08/18/16   Kozlow, Alvira Philips, MD      Allergies    Iodine and Other    Review of Systems   Review of Systems  Cardiovascular:  Positive for chest pain.  Gastrointestinal:  Positive for abdominal pain and nausea.  All other systems reviewed and are negative.   Physical Exam Updated Vital Signs BP (!) 128/96   Pulse 67   Temp 97.8 F (36.6 C)   Resp 12   SpO2 97%  Physical Exam Vitals and nursing note  reviewed.  Constitutional:      General: He is not in acute distress.    Appearance: He is well-developed.  HENT:     Head: Normocephalic and atraumatic.  Eyes:     Conjunctiva/sclera: Conjunctivae normal.  Cardiovascular:     Rate and Rhythm: Normal rate and regular rhythm.     Heart sounds: No murmur heard. Pulmonary:     Effort: Pulmonary effort is normal. No respiratory distress.     Breath sounds: Normal breath sounds.  Abdominal:     Palpations: Abdomen is soft.     Tenderness: There is abdominal tenderness in the epigastric area.     Comments: Mild epigastric tenderness to palpation, no rebound or guarding.  Musculoskeletal:        General: No swelling.     Cervical back: Neck supple.  Skin:    General: Skin is warm and dry.     Capillary Refill: Capillary refill takes less than 2 seconds.  Neurological:     Mental Status: He is alert.  Psychiatric:        Mood and Affect: Mood normal.     ED Results / Procedures / Treatments   Labs (all labs ordered are listed, but only abnormal results are displayed) Labs Reviewed  BASIC METABOLIC PANEL - Abnormal; Notable for the following components:      Result Value   Glucose, Bld 108 (*)  BUN 24 (*)    Creatinine, Ser 1.36 (*)    GFR, Estimated 59 (*)    All other components within normal limits  CBC - Abnormal; Notable for the following components:   WBC 10.7 (*)    All other components within normal limits  LIPASE, BLOOD - Abnormal; Notable for the following components:   Lipase 228 (*)    All other components within normal limits  RESP PANEL BY RT-PCR (RSV, FLU A&B, COVID)  RVPGX2  HEPATIC FUNCTION PANEL  TRIGLYCERIDES  TROPONIN I (HIGH SENSITIVITY)  TROPONIN I (HIGH SENSITIVITY)    EKG EKG Interpretation Date/Time:  Monday March 28 2023 11:34:06 EST Ventricular Rate:  83 PR Interval:  142 QRS Duration:  86 QT Interval:  340 QTC Calculation: 399 R Axis:   -4  Text Interpretation: Normal sinus  rhythm Normal ECG When compared with ECG of 02-Oct-2003 22:16, Incomplete right bundle branch block is no longer Present Confirmed by Ernie Avena ([id number]) on 03/28/2023 11:55:57 AM  Radiology DG Chest 2 View Result Date: 03/28/2023 CLINICAL DATA:  Chest pain.  Shortness of breath. EXAM: CHEST - 2 VIEW COMPARISON:  10/14/2008. FINDINGS: Bilateral lung fields are clear. Bilateral costophrenic angles are clear. Normal cardio-mediastinal silhouette. No acute osseous abnormalities. The soft tissues are within normal limits. IMPRESSION: No active cardiopulmonary disease. Electronically Signed   By: Jules Schick M.D.   On: 03/28/2023 15:16    Procedures Procedures    Medications Ordered in ED Medications  fentaNYL (SUBLIMAZE) injection 50 mcg (has no administration in time range)  alum & mag hydroxide-simeth (MAALOX/MYLANTA) 200-200-20 MG/5ML suspension 30 mL (30 mLs Oral Given 03/28/23 1308)    And  lidocaine (XYLOCAINE) 2 % viscous mouth solution 15 mL (15 mLs Oral Given 03/28/23 1308)  lactated ringers bolus 1,000 mL (1,000 mLs Intravenous New Bag/Given 03/28/23 1505)    ED Course/ Medical Decision Making/ A&P Clinical Course as of 03/28/23 1546  Mon Mar 28, 2023  1409 Lipase(!): 228 [JL]    Clinical Course User Index [JL] Ernie Avena, MD             HEART Score: 2                    Medical Decision Making Amount and/or Complexity of Data Reviewed Labs: ordered. Decision-making details documented in ED Course. Radiology: ordered.  Risk OTC drugs. Prescription drug management.    63 year old male with medical history significant for asthma, kidney stones, multiple food allergies who presents to the emergency department with epigastric abdominal pain radiating to his chest.  The patient has had symptoms for the past day or so.  He endorses mild nausea, no vomiting.  He is passing gas and moving his bowels normally.  No fevers, chills or cough.  He felt some generalized abdominal  bloating and discomfort as well which has since resolved.  He is tolerating oral intake.  Some burning sensation as well in his epigastrium.  Some radiation to his back.  On arrival, the patient was afebrile, not tachycardic or tachypneic, hemodynamically stable, saturating her percent on room air.  Patient with epigastric tenderness on exam.  Some radiation to the chest.  Some mild shortness of breath associated with the discomfort, at this time patient is relatively chest and abdominal pain-free.  Differential diagnosis includes pancreatitis, less likely ACS, viral syndrome, gastroenteritis, less likely PE, considered GERD and gastritis, considered constipation.  Low concern for small bowel obstruction or other acute intra-abdominal abnormality given  patient improvement in symptoms, passage of gas and stool.  Initial EKG revealed sinus rhythm, ventricular rate 83, no acute ischemic changes.  A chest x-ray was performed which revealed no acute cardiac or pulmonary abnormality.  Laboratory evaluation revealed normal cardiac troponins, BMP with mildly elevated but stable serum creatinine at 1.36, hepatic function panel normal, CBC with a nonspecific leukocytosis to 10.7, lipase elevated to 228.  Suspect likely acute pancreatitis in the setting of the patient's presentation.  CT of the abdomen pelvis without contrast was ordered given the patient's history of contrast allergy.  Patient was administered an LR bolus, tolerating oral Maalox and lidocaine without significant complaint, did not require pain medication in the ER, declined COVID and flu testing.  Patient does not drink alcohol, has no history of gallstones.  Patient CT imaging pending at time of signout, signout likely plan for discharge home for management of mild acute pancreatitis.  Signout given to Dr. Anitra Lauth at 1500 pending results of CT imaging and reassessment.   Final Clinical Impression(s) / ED Diagnoses Final diagnoses:  Acute  pancreatitis, unspecified complication status, unspecified pancreatitis type    Rx / DC Orders ED Discharge Orders     None         Ernie Avena, MD 03/28/23 1546

## 2023-03-28 NOTE — ED Triage Notes (Addendum)
 Pt is here for CP and sob.  CP is in central chest and is radiating to his back

## 2023-03-28 NOTE — Discharge Instructions (Addendum)
 Stick with a very bland diet over the next few days and then gradually start adding foods back in.  If you start having severe pain, vomiting or things are getting worse and not better return to the emergency room.

## 2023-06-04 DIAGNOSIS — Z789 Other specified health status: Secondary | ICD-10-CM | POA: Diagnosis not present

## 2023-06-04 DIAGNOSIS — I10 Essential (primary) hypertension: Secondary | ICD-10-CM | POA: Diagnosis not present

## 2023-06-04 DIAGNOSIS — Z760 Encounter for issue of repeat prescription: Secondary | ICD-10-CM | POA: Diagnosis not present

## 2023-06-04 DIAGNOSIS — J4520 Mild intermittent asthma, uncomplicated: Secondary | ICD-10-CM | POA: Diagnosis not present

## 2023-06-04 DIAGNOSIS — Z6828 Body mass index (BMI) 28.0-28.9, adult: Secondary | ICD-10-CM | POA: Diagnosis not present

## 2023-07-13 DIAGNOSIS — M25511 Pain in right shoulder: Secondary | ICD-10-CM | POA: Diagnosis not present

## 2023-07-28 DIAGNOSIS — M109 Gout, unspecified: Secondary | ICD-10-CM | POA: Diagnosis not present

## 2023-07-28 DIAGNOSIS — Z7951 Long term (current) use of inhaled steroids: Secondary | ICD-10-CM | POA: Diagnosis not present

## 2023-07-28 DIAGNOSIS — J45909 Unspecified asthma, uncomplicated: Secondary | ICD-10-CM | POA: Diagnosis not present

## 2023-07-28 DIAGNOSIS — I10 Essential (primary) hypertension: Secondary | ICD-10-CM | POA: Diagnosis not present

## 2023-08-24 ENCOUNTER — Ambulatory Visit (HOSPITAL_BASED_OUTPATIENT_CLINIC_OR_DEPARTMENT_OTHER): Admitting: Family Medicine

## 2023-08-24 ENCOUNTER — Encounter (HOSPITAL_BASED_OUTPATIENT_CLINIC_OR_DEPARTMENT_OTHER): Payer: Self-pay | Admitting: Family Medicine

## 2023-08-24 ENCOUNTER — Other Ambulatory Visit (HOSPITAL_BASED_OUTPATIENT_CLINIC_OR_DEPARTMENT_OTHER): Payer: Self-pay

## 2023-08-24 VITALS — BP 124/85 | HR 71 | Ht 70.5 in | Wt 223.9 lb

## 2023-08-24 DIAGNOSIS — J4540 Moderate persistent asthma, uncomplicated: Secondary | ICD-10-CM | POA: Diagnosis not present

## 2023-08-24 DIAGNOSIS — Z91018 Allergy to other foods: Secondary | ICD-10-CM | POA: Diagnosis not present

## 2023-08-24 DIAGNOSIS — M1A00X0 Idiopathic chronic gout, unspecified site, without tophus (tophi): Secondary | ICD-10-CM | POA: Insufficient documentation

## 2023-08-24 DIAGNOSIS — I10 Essential (primary) hypertension: Secondary | ICD-10-CM | POA: Insufficient documentation

## 2023-08-24 MED ORDER — LISINOPRIL 10 MG PO TABS
10.0000 mg | ORAL_TABLET | Freq: Every day | ORAL | 5 refills | Status: DC
  Filled 2023-08-24: qty 30, 30d supply, fill #0
  Filled 2023-09-13 – 2023-09-17 (×2): qty 30, 30d supply, fill #1
  Filled 2023-10-28: qty 30, 30d supply, fill #2

## 2023-08-24 MED ORDER — AIRSUPRA 90-80 MCG/ACT IN AERO
2.0000 | INHALATION_SPRAY | RESPIRATORY_TRACT | 3 refills | Status: AC | PRN
  Filled 2023-08-24: qty 10.7, 10d supply, fill #0

## 2023-08-24 MED ORDER — EPINEPHRINE 0.3 MG/0.3ML IJ SOAJ
0.3000 mg | INTRAMUSCULAR | 5 refills | Status: DC | PRN
  Filled 2023-08-24: qty 2, 28d supply, fill #0

## 2023-08-24 MED ORDER — ALLOPURINOL 100 MG PO TABS
200.0000 mg | ORAL_TABLET | Freq: Every day | ORAL | 5 refills | Status: DC
  Filled 2023-08-24: qty 60, 30d supply, fill #0
  Filled 2023-10-28: qty 60, 30d supply, fill #1

## 2023-08-24 MED ORDER — FLUTICASONE-SALMETEROL 115-21 MCG/ACT IN AERO
2.0000 | INHALATION_SPRAY | Freq: Two times a day (BID) | RESPIRATORY_TRACT | 5 refills | Status: DC
  Filled 2023-08-24 – 2023-10-28 (×4): qty 12, 30d supply, fill #0

## 2023-08-24 NOTE — Progress Notes (Signed)
 New Patient Office Visit  Subjective:   Raymond Foster 1960-03-28 08/24/2023  Chief Complaint  Patient presents with   New Patient (Initial Visit)    Patient is here today to get established with the practice. Has been having problems with his right shoulder.    HPI: Raymond Foster presents today to establish care at [facility] at [facility]. Introduced to Publishing rights manager role and practice setting.  All questions answered.   Last PCP: [facility]  Last annual physical: 2023 Concerns: See below    RIGHT SHOULDER PAIN :  Patient states he has been having chronic right shoulder pain with lifting of RUE. He has been seeing [facility] for chronic pain and has had xray and steroid injections in Feb and June of this year without significant relief. He is scheduled to return and discus MRI.    HYPERTENSION: Raymond Foster presents for the medical management of hypertension.  Patient's current hypertension medication regimen is: Lisinopril  20mg  (patient desiring to d/c medication due to weight loss, diet and exercise with improvement of BP)  Patient is  currently taking prescribed medications for HTN.  Patient is  regularly keeping a check on BP at home.  Adhering to low sodium diet: Yes Exercising Regularly: yes Denies headache, dizziness, CP, SHOB, vision changes.    BP Readings from Last 3 Encounters:  08/24/23 124/85  03/28/23 (!) 141/99  08/18/16 122/78    Multiple Food Allergies:  Patient reports multiple food allergies and has a list of foods he has to avoid due to allergic reactions. He does need a refill of his Epi Pen as his las pen has expired. He is interested in discussing further medications/injections with Allergist and returning to Allergist [place] due to constraints with his diet. He denies recent allergic reaction.    The following portions of the patient's history were reviewed and updated as appropriate: past  medical history, past surgical history, family history, social history, allergies, medications, and problem list.   Patient Active Problem List   Diagnosis Date Noted   Multiple food allergies 08/24/2023   Idiopathic chronic gout without tophus 08/24/2023   Essential (primary) hypertension 08/24/2023   Pain in joint of right shoulder 03/14/2023   Asthma 08/22/2015   Past Medical History:  Diagnosis Date   Asthma    Diastasis of rectus abdominis 10/12/2018   Added automatically from request for surgery [id number]     Kidney stones    Kidney stones 08/22/2015   Low back pain 01/06/2022   Multiple food allergies    Past Surgical History:  Procedure Laterality Date   LITHOTRIPSY     UMBILICAL HERNIA REPAIR     History reviewed. No pertinent family history. Social History   Socioeconomic History   Marital status: Married    Spouse name: Not on file   Number of children: Not on file   Years of education: Not on file   Highest education level: Not on file  Occupational History   Not on file  Tobacco Use   Smoking status: Never   Smokeless tobacco: Never  Vaping Use   Vaping status: Never Used  Substance and Sexual Activity   Alcohol use: No   Drug use: No   Sexual activity: Not on file  Other Topics Concern   Not on file  Social History Narrative   Not on file   Social Drivers of Health   Financial Resource Strain: Unknown (03/11/2021)  Received from [facility]   Overall Financial Resource Strain (CARDIA)    Difficulty of Paying Living Expenses: Patient declined  Food Insecurity: Unknown (03/11/2021)   Received from [facility]   Hunger Vital Sign    Within the past 12 months, you worried that your food would run out before you got the money to buy more.: Patient declined    Within the past 12 months, the food you bought just didn't last and you didn't have money to get more.: Patient declined  Transportation Needs: Unknown (03/11/2021)   Received from [facility] - Transportation    Lack of Transportation (Medical): Patient declined    Lack of Transportation (Non-Medical): Patient declined  Physical Activity: Sufficiently Active (03/11/2021)   Received from [facility]   Exercise Vital Sign    On average, how many days per week do you engage in moderate to strenuous exercise (like a brisk walk)?: 3 days    On average, how many minutes do you engage in exercise at this level?: 60 min  Stress: Unknown (03/11/2021)   Received from [facility] - Occupational Stress Questionnaire    Feeling of Stress : Patient declined  Social Connections: Unknown (06/11/2021)   Received from [facility]   Social Network    Social Network: Not on file  Intimate Partner Violence: Unknown (05/11/2021)   Received from [facility]   HITS    Physically Hurt: Not on file    Insult or Talk Down To: Not on file    Threaten Physical Harm: Not on file    Scream or Curse: Not on file   Outpatient Medications Prior to Visit  Medication Sig Dispense Refill   Albuterol  Sulfate (PROAIR  HFA IN) Inhale into the lungs.     allopurinol  (ZYLOPRIM ) 100 MG tablet Take 2 tablets by mouth daily.     EPINEPHrine  0.3 mg/0.3 mL IJ SOAJ injection Inject into the muscle once.     lisinopril  (ZESTRIL ) 30 MG tablet Take 1 tablet by mouth daily.     fluticasone  (FLOVENT  HFA) 220 MCG/ACT inhaler 3 inhalations three times a day with spacer (Patient not taking: Reported on 08/24/2023) 1 Inhaler 5   fluticasone -salmeterol (ADVAIR  HFA) 115-21 MCG/ACT inhaler Inhale into the lungs. (Patient not taking: Reported on 08/24/2023)     HYDROcodone -acetaminophen  (NORCO/VICODIN) 5-325 MG tablet Take 1-2 tablets by mouth every 6 (six) hours as needed. 10 tablet 0   montelukast  (SINGULAIR ) 10 MG tablet Take 1 tablet (10 mg total) by mouth at bedtime. 30 tablet 5   ondansetron  (ZOFRAN -ODT) 4 MG disintegrating tablet Take 1 tablet (4 mg total) by mouth every 8  (eight) hours as needed. 20 tablet 0   No facility-administered medications prior to visit.   Allergies  Allergen Reactions   Iodine Other (See Comments)    Can't remember reaction   Other     Other reaction(s): Unknown Many food allergies Many food allergies   Shellfish Allergy Swelling    ROS: A complete ROS was performed with pertinent positives/negatives noted in the HPI. The remainder of the ROS are negative.   Objective:   Today's Vitals   08/24/23 0910  BP: 124/85  Pulse: 71  SpO2: 98%  Weight: 223 lb 14.4 oz (101.6 kg)  Height: 5' 10.5 (1.791 m)    GENERAL: Well-appearing, in NAD. Well nourished.  SKIN: Pink, warm and dry.  Head: Normocephalic. NECK: Trachea midline. Full ROM w/o pain or  tenderness.  RESPIRATORY: Chest wall symmetrical. Respirations even and non-labored.  MSK: Muscle tone and strength appropriate for age. + Decreased abduction and internal rotation of RUE with pain.  NEUROLOGIC: No motor or sensory deficits. Steady, even gait. C2-C12 intact.  PSYCH/MENTAL STATUS: Alert, oriented x 3. Cooperative, appropriate mood and affect.    Health Maintenance Due  Topic Date Due   HIV Screening  Never done   Hepatitis C Screening  Never done    No results found for any visits on 08/24/23.     Assessment & Plan:  1. Moderate persistent asthma without complication (Primary) Controlled. Will continue Advair  and will see if Airsupra  is covered for his rescue. Use of medication reviewed with patient. Coupon provided and refills sent.   2. Multiple food allergies Epi Pen refilled. Recommend discussing biologic/injectable medications with Allergist. He states it has been several years since seeing [place] [facility], new referral placed.  - Ambulatory referral to Allergy  3. Idiopathic chronic gout without tophus, unspecified site Controlled. Allopurinol  refilled. Will obtain labs with upcoming AE.   4. Essential (primary) hypertension Controlled and  improved. PCP agreeable to decrase his Lisinopril  to 10mg . Pt to monitor BP.     Patient to reach out to office if new, worrisome, or unresolved symptoms arise or if no improvement in patient's condition. Patient verbalized understanding and is agreeable to treatment plan. All questions answered to patient's satisfaction.    Return in about 6 weeks (around 10/05/2023) for ANNUAL PHYSICAL (fasting labs at visit) .    Thersia Schuyler Stark, [place]

## 2023-08-26 ENCOUNTER — Other Ambulatory Visit (HOSPITAL_BASED_OUTPATIENT_CLINIC_OR_DEPARTMENT_OTHER): Payer: Self-pay

## 2023-08-30 ENCOUNTER — Other Ambulatory Visit (HOSPITAL_BASED_OUTPATIENT_CLINIC_OR_DEPARTMENT_OTHER): Payer: Self-pay

## 2023-09-13 ENCOUNTER — Other Ambulatory Visit (HOSPITAL_BASED_OUTPATIENT_CLINIC_OR_DEPARTMENT_OTHER): Payer: Self-pay

## 2023-10-28 ENCOUNTER — Other Ambulatory Visit (HOSPITAL_BASED_OUTPATIENT_CLINIC_OR_DEPARTMENT_OTHER): Payer: Self-pay

## 2023-11-07 ENCOUNTER — Ambulatory Visit (INDEPENDENT_AMBULATORY_CARE_PROVIDER_SITE_OTHER): Admitting: Family Medicine

## 2023-11-07 ENCOUNTER — Encounter (HOSPITAL_BASED_OUTPATIENT_CLINIC_OR_DEPARTMENT_OTHER): Payer: Self-pay | Admitting: Family Medicine

## 2023-11-07 VITALS — BP 122/91 | HR 68 | Ht 70.5 in | Wt 223.9 lb

## 2023-11-07 DIAGNOSIS — Z125 Encounter for screening for malignant neoplasm of prostate: Secondary | ICD-10-CM | POA: Diagnosis not present

## 2023-11-07 DIAGNOSIS — Z91018 Allergy to other foods: Secondary | ICD-10-CM

## 2023-11-07 DIAGNOSIS — R0683 Snoring: Secondary | ICD-10-CM | POA: Insufficient documentation

## 2023-11-07 DIAGNOSIS — Z0000 Encounter for general adult medical examination without abnormal findings: Secondary | ICD-10-CM

## 2023-11-07 DIAGNOSIS — L819 Disorder of pigmentation, unspecified: Secondary | ICD-10-CM

## 2023-11-07 DIAGNOSIS — Z13220 Encounter for screening for lipoid disorders: Secondary | ICD-10-CM

## 2023-11-07 DIAGNOSIS — I10 Essential (primary) hypertension: Secondary | ICD-10-CM

## 2023-11-07 MED ORDER — LISINOPRIL 20 MG PO TABS: 20.0000 mg | ORAL_TABLET | Freq: Every day | ORAL | 3 refills | Status: DC

## 2023-11-07 MED ORDER — NEFFY 2 MG/0.1ML NA SOLN: 1.0000 | NASAL | 6 refills | Status: DC | PRN

## 2023-11-07 NOTE — Patient Instructions (Signed)
 [phone]  [phone]

## 2023-11-07 NOTE — Progress Notes (Signed)
 Subjective:   Raymond Foster 03-09-1960 11/07/2023  CC: Chief Complaint  Patient presents with   Annual Exam    Patient is here today for his physical. Does have some things that he wants to discuss with PCP.    HPI: Raymond Foster is a 63 y.o. male who presents for a routine health maintenance exam.  Labs collected at time of visit.    FOOD ALLERGIES:  Patient requesting to try [facility] epinephrine  nasal spray for anaphylaxis with multiple food allergies. Patient currently carries epi pen and would like to use Neffi nasal spray for reaction. He has a list of approx. 30 or more food allergies that he carries daily.   SLEEP APNEA: Raymond Foster presents for the medical management of snoring. He is concerned about possible OSA and has never had any evaluation. Reports history of snoring, fatigue, somnolence for several years and headaches. CPAP use:  no Last sleep study: never  Daytime hypersomnolence:  yes Fatigue:  yes Insomnia:  no Good sleep hygiene:  yes Snoring:  yes Observed apnea by bed partner: yes, reported by spouse.    HYPERTENSION: Raymond Foster presents for the medical management of hypertension.  Patient's current hypertension medication regimen is: Lisinopril  10mg  (patient desired to decrease dosage at last visit). Reports Bp has been elevated since decrease of dosage from 20mg  to 10mg .  Patient is  currently taking prescribed medications for HTN.  Patient is  regularly keeping a check on BP at home.  Adhering to low sodium diet: Yes Exercising Regularly: Yes Denies headache, dizziness, CP, SHOB, vision changes.   BP Readings from Last 3 Encounters:  11/07/23 (!) 122/91  08/24/23 124/85  03/28/23 (!) 141/99      HEALTH SCREENINGS: - Vision Screening: not applicable - Dental Visits: up to date - Testicular Exam: Declined - STD Screening: Declined - PSA (50+): Ordered today  No results found for: PSA1, PSA   - Colonoscopy (45+): Up to  date  Discussed with patient purpose of the colonoscopy is to detect colon cancer at curable precancerous or early stages  - AAA Screening: Not applicable  Men age 92-75 who have ever smoked - Lung Cancer screening with low-dose CT: Not applicable-  Adults age 27-80 who are current cigarette smokers or quit within the last 15 years. Must have 20 pack year history.   Depression and Anxiety Screen done today and results listed below:     11/07/2023    1:05 PM 08/24/2023    9:15 AM  Depression screen PHQ 2/9  Decreased Interest 0 0  Down, Depressed, Hopeless 0 0  PHQ - 2 Score 0 0  Altered sleeping 0 0  Tired, decreased energy 0 3  Change in appetite 0 0  Feeling bad or failure about yourself  0 0  Trouble concentrating 0 0  Moving slowly or fidgety/restless 0 0  Suicidal thoughts 0 0  PHQ-9 Score 0 3  Difficult doing work/chores Not difficult at all Not difficult at all      11/07/2023    1:06 PM 08/24/2023    9:15 AM  GAD 7 : Generalized Anxiety Score  Nervous, Anxious, on Edge 0 0  Control/stop worrying 0 0  Worry too much - different things 0 0  Trouble relaxing 0 0  Restless 0 0  Easily annoyed or irritable 0 0  Afraid - awful might happen 0 0  Total GAD 7 Score 0 0  Anxiety Difficulty Not difficult at all  Not difficult at all    IMMUNIZATIONS:  - Tdap: Tetanus vaccination status reviewed: Declined. - Influenza: Refused - Pneumovax: Not applicable - Prevnar: Not applicable - Shingrix vaccine (50+): UTD per patient    Past medical history, surgical history, medications, allergies, family history and social history reviewed with patient today and changes made to appropriate areas of the chart.   Past Medical History:  Diagnosis Date   Asthma    Diastasis of rectus abdominis 10/12/2018   Added automatically from request for surgery [id number]     Kidney stones    Kidney stones 08/22/2015   Low back pain 01/06/2022   Multiple food allergies     Past Surgical  History:  Procedure Laterality Date   LITHOTRIPSY     UMBILICAL HERNIA REPAIR      Current Outpatient Medications on File Prior to Visit  Medication Sig   Albuterol  Sulfate (PROAIR  HFA IN) Inhale into the lungs.   Albuterol -Budesonide  (AIRSUPRA ) 90-80 MCG/ACT AERO Inhale 2 puffs into the lungs every 4 (four) hours as needed (shortness of breath).   allopurinol  (ZYLOPRIM ) 100 MG tablet Take 2 tablets (200 mg total) by mouth daily.   EPINEPHrine  0.3 mg/0.3 mL IJ SOAJ injection Inject 0.3 mg into the muscle as needed for anaphylaxis.   fluticasone -salmeterol (ADVAIR  HFA) 115-21 MCG/ACT inhaler Inhale 2 puffs into the lungs 2 (two) times daily. (Patient not taking: Reported on 11/07/2023)   No current facility-administered medications on file prior to visit.    Allergies  Allergen Reactions   Iodine Other (See Comments)    Can't remember reaction   Other     Other reaction(s): Unknown Many food allergies Many food allergies   Shellfish Allergy Swelling     Social History   Socioeconomic History   Marital status: Married    Spouse name: Not on file   Number of children: Not on file   Years of education: Not on file   Highest education level: Not on file  Occupational History   Not on file  Tobacco Use   Smoking status: Never   Smokeless tobacco: Never  Vaping Use   Vaping status: Never Used  Substance and Sexual Activity   Alcohol use: No   Drug use: No   Sexual activity: Not on file  Other Topics Concern   Not on file  Social History Narrative   Not on file   Social Drivers of Health   Financial Resource Strain: Unknown (03/11/2021)   Received from [facility]   Overall Financial Resource Strain (CARDIA)    Difficulty of Paying Living Expenses: Patient declined  Food Insecurity: Unknown (03/11/2021)   Received from [facility]   Hunger Vital Sign    Within the past 12 months, you worried that your food would run out before you got the money to buy more.:  Patient declined    Within the past 12 months, the food you bought just didn't last and you didn't have money to get more.: Patient declined  Transportation Needs: Unknown (03/11/2021)   Received from [facility] - Transportation    Lack of Transportation (Medical): Patient declined    Lack of Transportation (Non-Medical): Patient declined  Physical Activity: Sufficiently Active (03/11/2021)   Received from [facility]   Exercise Vital Sign    On average, how many days per week do you engage in moderate to strenuous exercise (like a brisk walk)?: 3 days    On average, how many minutes do you  engage in exercise at this level?: 60 min  Stress: Unknown (03/11/2021)   Received from [facility] of Occupational Health - Occupational Stress Questionnaire    Feeling of Stress : Patient declined  Social Connections: Unknown (06/11/2021)   Received from [facility]   Social Network    Social Network: Not on file  Intimate Partner Violence: Unknown (05/11/2021)   Received from [facility]   HITS    Physically Hurt: Not on file    Insult or Talk Down To: Not on file    Threaten Physical Harm: Not on file    Scream or Curse: Not on file   Social History   Tobacco Use  Smoking Status Never  Smokeless Tobacco Never   Social History   Substance and Sexual Activity  Alcohol Use No     History reviewed. No pertinent family history.   ROS: Denies fever, fatigue, unexplained weight loss/gain, CP, SHOB, and palpatitations. Denies neurological deficits, gastrointestinal and/or genitourinary complaints, and skin changes.   Objective:   Today's Vitals   11/07/23 1302 11/07/23 1337  BP: (!) 139/98 (!) 122/91  Pulse: 68   SpO2: 97%   Weight: 223 lb 14.4 oz (101.6 kg)   Height: 5' 10.5 (1.791 m)     GENERAL APPEARANCE: Well-appearing, in NAD. Well nourished.  SKIN: Pink, warm and dry. Turgor normal. No rash, , ulceration, or ecchymoses. Pink papular and  pigmented lesion present to right temple scalp. Hair evenly distributed.  HEENT: HEAD: Normocephalic.  EYES: PERRLA. EOMI. Lids intact w/o defect. Sclera white, Conjunctiva pink w/o exudate.  EARS: External ear w/o redness, swelling, masses or lesions. EAC clear. TM's intact, translucent w/o bulging, appropriate landmarks visualized. Appropriate acuity to conversational tones.  NOSE: Septum midline w/o deformity. Nares patent, mucosa pink and non-inflamed w/o drainage. No sinus tenderness.  THROAT: Uvula midline. Oropharynx clear. Tonsils non-inflamed w/o exudate. Oral mucosa pink and moist.  NECK: Supple, Trachea midline. Full ROM w/o pain or tenderness. No lymphadenopathy. Thyroid non-tender w/o enlargement or palpable masses.  RESPIRATORY: Chest wall symmetrical w/o masses. Respirations even and non-labored. Breath sounds clear to auscultation bilaterally. No wheezes, rales, rhonchi, or crackles. CARDIAC: S1, S2 present, regular rate and rhythm. No gallops, murmurs, rubs, or clicks. PMI w/o lifts, heaves, or thrills. No carotid bruits. Capillary refill <2 seconds. Peripheral pulses 2+ bilaterally. GI: Abdomen soft w/o distention. Normoactive bowel sounds. No palpable masses or tenderness. No guarding or rebound tenderness. Liver and spleen w/o tenderness or enlargement. No CVA tenderness.  GU: pt deferred exam. MSK: Muscle tone and strength appropriate for age, w/o atrophy or abnormal movement. EXTREMITIES: Active ROM intact, w/o tenderness, crepitus, or contracture. No obvious joint deformities or effusions. No clubbing, edema, or cyanosis.  NEUROLOGIC: CN's II-XII intact. Motor strength symmetrical with no obvious weakness. No sensory deficits. DTR 2+ symmetric bilaterally. Steady, even gait.  PSYCH/MENTAL STATUS: Alert, oriented x 3. Cooperative, appropriate mood and affect.     Assessment & Plan:  1. Annual physical exam (Primary) Discussed preventative screenings, vaccines, and healthy  lifestyle with patient.  - CBC with Differential/Platelet - Comprehensive metabolic panel with GFR - Lipid panel - TSH - PSA  2. Essential (primary) hypertension Uncontrolled. Recommend returning to Lisinopril  20mg  daily and return for Bp check in 6 weeks.  - lisinopril  (ZESTRIL ) 20 MG tablet; Take 1 tablet (20 mg total) by mouth daily.  Dispense: 90 tablet; Refill: 3  3. Multiple food allergies Patient was referred to Allergy and  Asthma for management but has not established care. Discussed importance of epi use with anaphylaxis and will send in [facility] for patient. He should use EpiPen  if needed if not covered for [facility]. If use of Neffi, patient agreeable to go to ER after use and call 911.   4. Snoring Referral placed to Pulmonology for OSA evaluation.  - Ambulatory referral to Pulmonology  5. Screening for lipid disorders - Lipid panel  6. Prostate cancer screening - PSA  7. Pigmented skin lesion suspicious for malignant neoplasm Recommend patient obtain Dermatology appt for full body skin check and area of concern. Pt agreeable,declined referral today.     Orders Placed This Encounter  Procedures   CBC with Differential/Platelet   Comprehensive metabolic panel with GFR   Lipid panel   TSH   PSA   Ambulatory referral to Pulmonology    Referral Priority:   Routine    Referral Type:   Consultation    Referral Reason:   Specialty Services Required    Requested Specialty:   Pulmonary Disease    Number of Visits Requested:   1    PATIENT COUNSELING: - Encouraged to adjust caloric intake to maintain or achieve ideal body weight, to reduce intake of dietary saturated fat and total fat, to limit sodium intake by avoiding high sodium foods and not adding table salt, and to maintain adequate dietary potassium and calcium preferably from fresh fruits, vegetables, and low-fat dairy products.   - Advised to avoid cigarette smoking. - Discussed with the patient that most people  either abstain from alcohol or drink within safe limits (<=14/week and <=4 drinks/occasion for males, <=7/weeks and <= 3 drinks/occasion for females) and that the risk for alcohol disorders and other health effects rises proportionally with the number of drinks per week and how often a drinker exceeds daily limits. - Discussed cessation/primary prevention of drug use and availability of treatment for abuse.   - Stressed the importance of regular exercise - Injury prevention: Discussed safety belts, safety helmets, smoke detector, smoking near bedding or upholstery.  - Dental health: Discussed importance of regular tooth brushing, flossing, and dental visits.  - Sexuality: Discussed sexually transmitted diseases, partner selection, use of condoms, avoidance of unintended pregnancy  and contraceptive alternatives.   NEXT PREVENTATIVE PHYSICAL DUE IN 1 YEAR.  Return for 6 weeks BP check only; 1 year AE.  Patient to reach out to office if new, worrisome, or unresolved symptoms arise or if no improvement in patient's condition. Patient verbalized understanding and is agreeable to treatment plan. All questions answered to patient's satisfaction.    Thersia Schuyler Stark, [place]

## 2023-11-08 LAB — CBC WITH DIFFERENTIAL/PLATELET
Basophils Absolute: 0 x10E3/uL (ref 0.0–0.2)
Basos: 0 %
EOS (ABSOLUTE): 0.1 x10E3/uL (ref 0.0–0.4)
Eos: 2 %
Hematocrit: 45.5 % (ref 37.5–51.0)
Hemoglobin: 15.5 g/dL (ref 13.0–17.7)
Immature Grans (Abs): 0 x10E3/uL (ref 0.0–0.1)
Immature Granulocytes: 0 %
Lymphocytes Absolute: 0.9 x10E3/uL (ref 0.7–3.1)
Lymphs: 16 %
MCH: 31.3 pg (ref 26.6–33.0)
MCHC: 34.1 g/dL (ref 31.5–35.7)
MCV: 92 fL (ref 79–97)
Monocytes Absolute: 0.6 x10E3/uL (ref 0.1–0.9)
Monocytes: 10 %
Neutrophils Absolute: 4.1 x10E3/uL (ref 1.4–7.0)
Neutrophils: 72 %
Platelets: 192 x10E3/uL (ref 150–450)
RBC: 4.96 x10E6/uL (ref 4.14–5.80)
RDW: 12.7 % (ref 11.6–15.4)
WBC: 5.8 x10E3/uL (ref 3.4–10.8)

## 2023-11-08 LAB — COMPREHENSIVE METABOLIC PANEL WITH GFR
ALT: 29 IU/L (ref 0–44)
AST: 23 IU/L (ref 0–40)
Albumin: 4.4 g/dL (ref 3.9–4.9)
Alkaline Phosphatase: 80 IU/L (ref 47–123)
BUN/Creatinine Ratio: 14 (ref 10–24)
BUN: 17 mg/dL (ref 8–27)
Bilirubin Total: 0.6 mg/dL (ref 0.0–1.2)
CO2: 23 mmol/L (ref 20–29)
Calcium: 9.6 mg/dL (ref 8.6–10.2)
Chloride: 102 mmol/L (ref 96–106)
Creatinine, Ser: 1.21 mg/dL (ref 0.76–1.27)
Globulin, Total: 2.5 g/dL (ref 1.5–4.5)
Glucose: 87 mg/dL (ref 70–99)
Potassium: 5 mmol/L (ref 3.5–5.2)
Sodium: 139 mmol/L (ref 134–144)
Total Protein: 6.9 g/dL (ref 6.0–8.5)
eGFR: 68 mL/min/1.73 (ref >59)

## 2023-11-08 LAB — LIPID PANEL
Chol/HDL Ratio: 4.2 ratio (ref 0.0–5.0)
Cholesterol, Total: 178 mg/dL (ref 100–199)
HDL: 42 mg/dL (ref >39)
LDL Chol Calc (NIH): 113 mg/dL — ABNORMAL HIGH (ref 0–99)
Triglycerides: 125 mg/dL (ref 0–149)
VLDL Cholesterol Cal: 23 mg/dL (ref 5–40)

## 2023-11-08 LAB — PSA: Prostate Specific Ag, Serum: 2.3 ng/mL (ref 0.0–4.0)

## 2023-11-08 LAB — TSH: TSH: 2.06 u[IU]/mL (ref 0.450–4.500)

## 2023-11-09 ENCOUNTER — Ambulatory Visit (HOSPITAL_BASED_OUTPATIENT_CLINIC_OR_DEPARTMENT_OTHER): Payer: Self-pay | Admitting: Family Medicine

## 2023-11-09 NOTE — Progress Notes (Signed)
 Hi Raymond Foster, Your LDL cholesterol was slightly elevated but is not requiring cholesterol lowering therapies at this time.  Please continue heart healthy diet and regular exercise.  Your blood counts are stable.  Your electrolytes, kidney and liver function are within normal range.  Your thyroid function and prostate-specific antigen are also well-controlled.

## 2023-11-10 ENCOUNTER — Encounter (HOSPITAL_BASED_OUTPATIENT_CLINIC_OR_DEPARTMENT_OTHER): Payer: Self-pay | Admitting: Family Medicine

## 2023-11-11 ENCOUNTER — Other Ambulatory Visit (HOSPITAL_BASED_OUTPATIENT_CLINIC_OR_DEPARTMENT_OTHER): Payer: Self-pay | Admitting: Family Medicine

## 2023-11-11 DIAGNOSIS — I10 Essential (primary) hypertension: Secondary | ICD-10-CM

## 2023-11-11 MED ORDER — FLUTICASONE-SALMETEROL 115-21 MCG/ACT IN AERO: 2.0000 | INHALATION_SPRAY | Freq: Two times a day (BID) | RESPIRATORY_TRACT | 5 refills | Status: AC

## 2023-11-11 MED ORDER — LISINOPRIL 20 MG PO TABS: 20.0000 mg | ORAL_TABLET | Freq: Every day | ORAL | 3 refills | Status: AC

## 2023-11-11 MED ORDER — ALLOPURINOL 100 MG PO TABS: 200.0000 mg | ORAL_TABLET | Freq: Every day | ORAL | 3 refills | Status: AC

## 2023-11-11 MED ORDER — EPINEPHRINE 0.3 MG/0.3ML IJ SOAJ: 0.3000 mg | INTRAMUSCULAR | 5 refills | Status: DC | PRN

## 2023-11-11 NOTE — Telephone Encounter (Signed)
 Please see mychart message sent by pt and advise.

## 2023-11-18 ENCOUNTER — Encounter (HOSPITAL_BASED_OUTPATIENT_CLINIC_OR_DEPARTMENT_OTHER): Payer: Self-pay | Admitting: Family Medicine

## 2023-11-28 ENCOUNTER — Encounter (HOSPITAL_BASED_OUTPATIENT_CLINIC_OR_DEPARTMENT_OTHER): Payer: Self-pay | Admitting: Family Medicine

## 2023-11-28 ENCOUNTER — Other Ambulatory Visit (HOSPITAL_BASED_OUTPATIENT_CLINIC_OR_DEPARTMENT_OTHER): Payer: Self-pay | Admitting: Family Medicine

## 2023-11-28 MED ORDER — EPINEPHRINE 0.3 MG/0.3ML IJ SOAJ: 0.3000 mg | INTRAMUSCULAR | 5 refills | Status: AC | PRN

## 2023-11-30 ENCOUNTER — Encounter (HOSPITAL_BASED_OUTPATIENT_CLINIC_OR_DEPARTMENT_OTHER): Payer: Self-pay

## 2023-11-30 ENCOUNTER — Ambulatory Visit (INDEPENDENT_AMBULATORY_CARE_PROVIDER_SITE_OTHER)

## 2023-11-30 VITALS — BP 122/75 | HR 86 | Ht 70.5 in | Wt 222.4 lb

## 2023-11-30 DIAGNOSIS — G4719 Other hypersomnia: Secondary | ICD-10-CM | POA: Diagnosis not present

## 2023-11-30 NOTE — Patient Instructions (Signed)
 Complete home sleep test; ordered today.  Follow up in 10-12 weeks for sleep test results.  Follow sleep hygiene as discussed.

## 2023-11-30 NOTE — Progress Notes (Signed)
 @Patient  ID: Raymond Foster, male    DOB: Jun 23, 1960, 63 y.o.   MRN: [id number]  Chief Complaint  Patient presents with   Consult    Sleep    Referring provider: Knute Thersia Bitters, *  HPI: Discussed the use of AI scribe software for clinical note transcription with the patient, who gave verbal consent to proceed.  History of Present Illness Raymond Foster is a 63 year old male with asthma and hypertension who presents for a sleep evaluation due to snoring and suspected sleep apnea.  He has a long-standing history of snoring, which has led him to sleep in a separate room. Others have informed him that he stops breathing during sleep. He wakes up feeling exhausted and sometimes experiences sleepiness while driving, although he has not fallen asleep at the wheel. He goes to bed around midnight and wakes up around 8 AM, with occasional awakenings during the night to use the bathroom or due to shortness of breath, especially after eating too much at night.  He has a lifelong history of asthma, currently managed with Advair , Air Supra, and albuterol . He experiences shortness of breath at rest and reports a history of asthma and seasonal allergies. He has a history of sinus surgery following an orbital fracture in the 1980s and has undergone allergy treatments in the past, including injections.  His weight has decreased by about 20 pounds intentionally through diet and exercise. He has a history of pancreatitis and kidney stones, which led him to stop consuming caffeine about eight months ago. He does not smoke or consume alcohol, having quit alcohol over 40 years ago. He denies any drug use.  He experiences a racing heart upon waking up. No cough, fever, chills, irregular heartbeats, or palpitations.     TEST/EVENTS :  Epworth of 13 at today's appt  Chest 2view 03/28/2023:   Narrative & Impression  CLINICAL DATA:  Chest pain.  Shortness of breath.   EXAM: CHEST - 2 VIEW    COMPARISON:  10/14/2008.   FINDINGS: Bilateral lung fields are clear. Bilateral costophrenic angles are clear.   Normal cardio-mediastinal silhouette.   No acute osseous abnormalities.   The soft tissues are within normal limits.   IMPRESSION: No active cardiopulmonary disease.    Allergies  Allergen Reactions   Iodine Other (See Comments)    Can't remember reaction   Other     Other reaction(s): Unknown Many food allergies Many food allergies   Shellfish Allergy Swelling    Immunization History  Administered Date(s) Administered   Influenza Inj Mdck Quad Pf 11/10/2017   Influenza Inj Mdck Quad With Preservative 01/02/2019   Influenza, Quadrivalent, Recombinant, Inj, Pf 11/08/2017   Influenza-Unspecified 11/06/2016   Pneumococcal Polysaccharide-23 11/10/2017   Tdap 02/09/2011   Zoster Recombinant(Shingrix) 01/02/2019    Past Medical History:  Diagnosis Date   Asthma    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     Albuterol -Budesonide  (AIRSUPRA ) 90-80 MCG/ACT AERO Inhale 2 puffs into the lungs every 4 (four) hours  as needed (shortness of breath). 10.7 g 3   allopurinol  (ZYLOPRIM ) 100 MG tablet Take 2 tablets (200 mg total) by mouth daily. 180 tablet 3   EPINEPHrine  0.3 mg/0.3 mL IJ SOAJ injection Inject 0.3 mg into the muscle as needed for anaphylaxis. 2 each 5   fluticasone -salmeterol (ADVAIR  HFA) 115-21 MCG/ACT inhaler Inhale 2 puffs into the lungs 2 (two) times daily. 12 g 5   lisinopril  (ZESTRIL ) 20 MG tablet Take 1 tablet (20 mg total) by  mouth daily. 90 tablet 3   No facility-administered medications prior to visit.     Review of Systems: as per HPI  Constitutional:   No  weight loss, night sweats,  Fevers, chills, fatigue, or  lassitude.  HEENT:   No headaches,  Difficulty swallowing,  Tooth/dental problems, or  Sore throat,                No sneezing, itching, ear ache, nasal congestion, post nasal drip,   CV:  No chest pain,  Orthopnea, PND, swelling in lower extremities, anasarca, dizziness, palpitations, syncope.   GI  No heartburn, indigestion, abdominal pain, nausea, vomiting, diarrhea, change in bowel habits, loss of appetite, bloody stools.   Resp: No shortness of breath with exertion or at rest.  No excess mucus, no productive cough,  No non-productive cough,  No coughing up of blood.  No change in color of mucus.  No wheezing.  No chest wall deformity  Skin: no rash or lesions.  GU: no dysuria, change in color of urine, no urgency or frequency.  No flank pain, no hematuria   MS:  No joint pain or swelling.  No decreased range of motion.  No back pain.    Physical Exam  BP 122/75   Pulse 86   Ht 5' 10.5 (1.791 m)   Wt 222 lb 6.4 oz (100.9 kg)   SpO2 95%   BMI 31.46 kg/m   GEN: A/Ox3; pleasant , NAD, well nourished    HEENT:  [place]/AT,  EACs-clear, TMs-wnl, NOSE-clear, THROAT-clear, no lesions, no postnasal drip or exudate noted. Mallampati 4  NECK:  Supple w/ fair ROM; no JVD; normal carotid impulses w/o bruits; no thyromegaly or nodules palpated; no lymphadenopathy.    RESP  Clear  P & A; w/o, wheezes/ rales/ or rhonchi. no accessory muscle use, no dullness to percussion  CARD:  RRR, no m/r/g, no peripheral edema, pulses intact, no cyanosis or clubbing.  GI:   Soft & nt; nml bowel sounds; no organomegaly or masses detected.   Musco: Warm bil, no deformities or joint swelling noted.   Neuro: alert, no focal deficits noted.    Skin: Warm, no lesions or rashes    Lab Results:  CBC     Component Value Date/Time   WBC 5.8 11/07/2023 1455   WBC 10.7 (H) 03/28/2023 1143   RBC 4.96 11/07/2023 1455   RBC 5.39 03/28/2023 1143   HGB 15.5 11/07/2023 1455   HCT 45.5 11/07/2023 1455   PLT 192 11/07/2023 1455   MCV 92 11/07/2023 1455   MCH 31.3 11/07/2023 1455   MCH 31.4 03/28/2023 1143   MCHC 34.1 11/07/2023 1455   MCHC 34.1 03/28/2023 1143   RDW 12.7 11/07/2023 1455   LYMPHSABS 0.9 11/07/2023 1455   EOSABS 0.1 11/07/2023 1455   BASOSABS 0.0 11/07/2023 1455    BMET    Component Value Date/Time   NA 139 11/07/2023 1455   K 5.0 11/07/2023 1455   CL 102 11/07/2023  1455   CO2 23 11/07/2023 1455   GLUCOSE 87 11/07/2023 1455   GLUCOSE 108 (H) 03/28/2023 1143   BUN 17 11/07/2023 1455   CREATININE 1.21 11/07/2023 1455   CALCIUM 9.6 11/07/2023 1455   GFRNONAA 59 (L) 03/28/2023 1143    BNP No results found for: BNP  ProBNP No results found for: PROBNP  Imaging: No results found.  Administration History     None           No data to display          No results found for: NITRICOXIDE   Assessment & Plan:  KAENAN JAKE is a 63 year old male with asthma and hypertension who presents for a sleep evaluation due to snoring and suspected sleep apnea. Assessment & Plan Excessive daytime sleepiness -Based on c/o snoring, excessive daytime sleepiness, and elevated ESS at 13, there is significant concern for sleep apnea.  -Plan for home sleep test; follow up in 10-12 weeks for review of results.  -Potential treatment options reviewed with patient.  -Sleep hygiene reviewed with patient.   Return in about 10 weeks (around 02/08/2024) for sleep study review.  Candis Dandy, PA-C 11/30/2023

## 2023-11-30 NOTE — Progress Notes (Signed)
 Epworth Sleepiness Scale  Use the following scale to choose the most appropriate number for each situation. 0 Would never nod off 1  Slight  chance of nodding off 2 Moderate chance of nodding off 3 High chance of nodding off  Sitting and reading: 3 Watching TV: 3 Sitting, inactive, in a public place (e.g., in a meeting, theater, or dinner event): 0 As a passenger in a car for an hour or more without stopping for a break: 1 Lying down to rest when circumstances permit:3 Sitting and talking to someone: 0 Sitting quietly after a meal without alcohol: 3 In a car, while stopped for a few  minutes in traffic or at a light: 1  TOTOAL: 14

## 2023-12-13 ENCOUNTER — Ambulatory Visit (HOSPITAL_BASED_OUTPATIENT_CLINIC_OR_DEPARTMENT_OTHER)

## 2023-12-20 DIAGNOSIS — H43812 Vitreous degeneration, left eye: Secondary | ICD-10-CM | POA: Diagnosis not present

## 2023-12-26 DIAGNOSIS — J0140 Acute pansinusitis, unspecified: Secondary | ICD-10-CM | POA: Diagnosis not present

## 2023-12-26 DIAGNOSIS — Z6829 Body mass index (BMI) 29.0-29.9, adult: Secondary | ICD-10-CM | POA: Diagnosis not present

## 2023-12-26 DIAGNOSIS — R052 Subacute cough: Secondary | ICD-10-CM | POA: Diagnosis not present

## 2024-01-20 DIAGNOSIS — Z6829 Body mass index (BMI) 29.0-29.9, adult: Secondary | ICD-10-CM | POA: Diagnosis not present

## 2024-01-20 DIAGNOSIS — J0101 Acute recurrent maxillary sinusitis: Secondary | ICD-10-CM | POA: Diagnosis not present

## 2024-02-06 ENCOUNTER — Ambulatory Visit (HOSPITAL_BASED_OUTPATIENT_CLINIC_OR_DEPARTMENT_OTHER)

## 2024-03-27 ENCOUNTER — Ambulatory Visit (HOSPITAL_BASED_OUTPATIENT_CLINIC_OR_DEPARTMENT_OTHER)
